# Patient Record
Sex: Female | Born: 1960 | Race: White | Hispanic: No | Smoking: Current some day smoker
Health system: Southern US, Community
[De-identification: ages and names within clinical notes are randomized; demographics above are authoritative.]

## PROBLEM LIST (undated history)

## (undated) DIAGNOSIS — E119 Type 2 diabetes mellitus without complications: Secondary | ICD-10-CM

## (undated) DIAGNOSIS — I10 Essential (primary) hypertension: Secondary | ICD-10-CM

## (undated) HISTORY — PX: ANKLE FRACTURE SURGERY: SHX122

---

## 1997-10-17 ENCOUNTER — Ambulatory Visit (HOSPITAL_COMMUNITY): Admission: RE | Admit: 1997-10-17 | Discharge: 1997-10-17 | Payer: Self-pay | Admitting: *Deleted

## 1998-07-24 ENCOUNTER — Encounter: Admission: RE | Admit: 1998-07-24 | Discharge: 1998-07-24 | Payer: Self-pay | Admitting: Family Medicine

## 1998-10-23 ENCOUNTER — Encounter: Admission: RE | Admit: 1998-10-23 | Discharge: 1998-10-23 | Payer: Self-pay | Admitting: Family Medicine

## 1998-10-31 ENCOUNTER — Encounter: Admission: RE | Admit: 1998-10-31 | Discharge: 1998-10-31 | Payer: Self-pay | Admitting: Family Medicine

## 1998-11-07 ENCOUNTER — Encounter: Admission: RE | Admit: 1998-11-07 | Discharge: 1998-11-07 | Payer: Self-pay | Admitting: Family Medicine

## 1999-01-30 ENCOUNTER — Encounter: Admission: RE | Admit: 1999-01-30 | Discharge: 1999-01-30 | Payer: Self-pay | Admitting: Family Medicine

## 1999-07-28 ENCOUNTER — Encounter: Admission: RE | Admit: 1999-07-28 | Discharge: 1999-07-28 | Payer: Self-pay | Admitting: Family Medicine

## 1999-10-21 ENCOUNTER — Encounter: Admission: RE | Admit: 1999-10-21 | Discharge: 1999-10-21 | Payer: Self-pay | Admitting: Sports Medicine

## 1999-11-09 ENCOUNTER — Encounter: Payer: Self-pay | Admitting: Specialist

## 1999-11-09 ENCOUNTER — Inpatient Hospital Stay (HOSPITAL_COMMUNITY): Admission: EM | Admit: 1999-11-09 | Discharge: 1999-11-11 | Payer: Self-pay | Admitting: Emergency Medicine

## 1999-11-09 ENCOUNTER — Encounter: Payer: Self-pay | Admitting: Emergency Medicine

## 2000-01-13 ENCOUNTER — Encounter: Admission: RE | Admit: 2000-01-13 | Discharge: 2000-01-13 | Payer: Self-pay | Admitting: Family Medicine

## 2000-04-21 ENCOUNTER — Encounter: Admission: RE | Admit: 2000-04-21 | Discharge: 2000-04-21 | Payer: Self-pay | Admitting: Family Medicine

## 2000-04-28 ENCOUNTER — Encounter: Admission: RE | Admit: 2000-04-28 | Discharge: 2000-04-28 | Payer: Self-pay | Admitting: Family Medicine

## 2000-07-28 ENCOUNTER — Encounter: Admission: RE | Admit: 2000-07-28 | Discharge: 2000-07-28 | Payer: Self-pay | Admitting: Family Medicine

## 2000-09-17 ENCOUNTER — Ambulatory Visit (HOSPITAL_COMMUNITY): Admission: RE | Admit: 2000-09-17 | Discharge: 2000-09-17 | Payer: Self-pay | Admitting: Orthopaedic Surgery

## 2000-09-17 ENCOUNTER — Encounter: Payer: Self-pay | Admitting: Orthopaedic Surgery

## 2000-09-17 ENCOUNTER — Emergency Department (HOSPITAL_COMMUNITY): Admission: EM | Admit: 2000-09-17 | Discharge: 2000-09-17 | Payer: Self-pay | Admitting: Emergency Medicine

## 2000-09-17 ENCOUNTER — Encounter: Payer: Self-pay | Admitting: Emergency Medicine

## 2000-11-26 ENCOUNTER — Encounter: Admission: RE | Admit: 2000-11-26 | Discharge: 2000-11-26 | Payer: Self-pay | Admitting: Family Medicine

## 2001-02-24 ENCOUNTER — Encounter: Admission: RE | Admit: 2001-02-24 | Discharge: 2001-02-24 | Payer: Self-pay | Admitting: Sports Medicine

## 2001-03-07 ENCOUNTER — Encounter: Admission: RE | Admit: 2001-03-07 | Discharge: 2001-03-07 | Payer: Self-pay | Admitting: Family Medicine

## 2001-05-14 ENCOUNTER — Emergency Department (HOSPITAL_COMMUNITY): Admission: EM | Admit: 2001-05-14 | Discharge: 2001-05-14 | Payer: Self-pay | Admitting: Emergency Medicine

## 2001-05-24 ENCOUNTER — Encounter: Admission: RE | Admit: 2001-05-24 | Discharge: 2001-05-24 | Payer: Self-pay | Admitting: Family Medicine

## 2001-08-23 ENCOUNTER — Encounter: Admission: RE | Admit: 2001-08-23 | Discharge: 2001-08-23 | Payer: Self-pay | Admitting: Family Medicine

## 2001-11-04 ENCOUNTER — Encounter (INDEPENDENT_AMBULATORY_CARE_PROVIDER_SITE_OTHER): Payer: Self-pay | Admitting: *Deleted

## 2001-11-18 ENCOUNTER — Encounter: Admission: RE | Admit: 2001-11-18 | Discharge: 2001-11-18 | Payer: Self-pay | Admitting: Family Medicine

## 2002-02-14 ENCOUNTER — Encounter: Admission: RE | Admit: 2002-02-14 | Discharge: 2002-02-14 | Payer: Self-pay | Admitting: Family Medicine

## 2002-05-09 ENCOUNTER — Encounter: Admission: RE | Admit: 2002-05-09 | Discharge: 2002-05-09 | Payer: Self-pay | Admitting: Family Medicine

## 2002-08-03 ENCOUNTER — Encounter: Admission: RE | Admit: 2002-08-03 | Discharge: 2002-08-03 | Payer: Self-pay | Admitting: Family Medicine

## 2002-09-18 ENCOUNTER — Ambulatory Visit (HOSPITAL_COMMUNITY): Admission: RE | Admit: 2002-09-18 | Discharge: 2002-09-18 | Payer: Self-pay | Admitting: Specialist

## 2002-09-18 ENCOUNTER — Encounter: Payer: Self-pay | Admitting: Specialist

## 2002-11-03 ENCOUNTER — Encounter: Admission: RE | Admit: 2002-11-03 | Discharge: 2002-11-03 | Payer: Self-pay | Admitting: Family Medicine

## 2003-01-25 ENCOUNTER — Ambulatory Visit (HOSPITAL_COMMUNITY): Admission: RE | Admit: 2003-01-25 | Discharge: 2003-01-25 | Payer: Self-pay | Admitting: Internal Medicine

## 2003-01-25 ENCOUNTER — Encounter: Payer: Self-pay | Admitting: Internal Medicine

## 2004-09-16 ENCOUNTER — Ambulatory Visit (HOSPITAL_COMMUNITY): Admission: RE | Admit: 2004-09-16 | Discharge: 2004-09-16 | Payer: Self-pay

## 2006-06-04 ENCOUNTER — Encounter (INDEPENDENT_AMBULATORY_CARE_PROVIDER_SITE_OTHER): Payer: Self-pay | Admitting: *Deleted

## 2007-06-16 ENCOUNTER — Ambulatory Visit: Payer: Self-pay | Admitting: Gynecology

## 2009-02-14 ENCOUNTER — Inpatient Hospital Stay (HOSPITAL_COMMUNITY): Admission: EM | Admit: 2009-02-14 | Discharge: 2009-02-18 | Payer: Self-pay | Admitting: Emergency Medicine

## 2009-03-22 ENCOUNTER — Ambulatory Visit: Payer: Self-pay | Admitting: Family Medicine

## 2009-05-06 ENCOUNTER — Ambulatory Visit: Payer: Self-pay | Admitting: Family Medicine

## 2009-08-10 ENCOUNTER — Emergency Department (HOSPITAL_COMMUNITY): Admission: EM | Admit: 2009-08-10 | Discharge: 2009-08-10 | Payer: Self-pay | Admitting: Emergency Medicine

## 2010-03-10 ENCOUNTER — Emergency Department (HOSPITAL_COMMUNITY)
Admission: EM | Admit: 2010-03-10 | Discharge: 2010-03-10 | Payer: Self-pay | Source: Home / Self Care | Admitting: Emergency Medicine

## 2010-07-09 LAB — DIFFERENTIAL
Basophils Absolute: 0.1 10*3/uL (ref 0.0–0.1)
Lymphocytes Relative: 27 % (ref 12–46)
Lymphs Abs: 2.6 10*3/uL (ref 0.7–4.0)
Monocytes Absolute: 0.4 10*3/uL (ref 0.1–1.0)
Monocytes Relative: 4 % (ref 3–12)
Neutro Abs: 6.5 10*3/uL (ref 1.7–7.7)

## 2010-07-09 LAB — CULTURE, ROUTINE-ABSCESS: Culture: NO GROWTH

## 2010-07-09 LAB — CBC
Hemoglobin: 12 g/dL (ref 12.0–15.0)
RBC: 3.57 MIL/uL — ABNORMAL LOW (ref 3.87–5.11)
WBC: 9.8 10*3/uL (ref 4.0–10.5)

## 2010-07-09 LAB — CULTURE, BLOOD (ROUTINE X 2)
Culture: NO GROWTH
Culture: NO GROWTH

## 2010-07-09 LAB — ANAEROBIC CULTURE

## 2010-08-22 NOTE — Op Note (Signed)
[place]. [facility]  Patient:    [name], [name]                         MRN: [id number] Proc. Date: 11/09/99 Adm. Date:  [id number] Attending:  Lubertha South                           Operative Report  PREOPERATIVE DIAGNOSIS:  Right ankle closed trimalleolar posterior fracture dislocation.  POSTOPERATIVE DIAGNOSIS:  Right ankle closed trimalleolar posterior fracture dislocation.  PROCEDURE:  Open reduction, internal fixation of the right trimalleolar ankle fracture dislocation using a 3.5 lag screw with a six hole one-third semitubular plate to the lateral malleolus, two 4.0 lag screws to the medial malleolus and one 4.0 cannulated screw to the posterior malleolus.  SURGEON:  Kerrin Champagne, M.D.  ASSISTANT:  Avel Peace, P.A.-C.  ANESTHESIA:  GOT by Dr. Michelle Piper.  ESTIMATED BLOOD LOSS:  30 cc.  TOURNIQUET TIME:  350 mmHg 1 hour and 33 minutes.  BRIEF CLINICAL HISTORY:  The patient is a 50 year old female without significant medical illness related that last evening while at a party, she stepped into a hole with her right foot twisting it and sustaining an injury that she felt was a sprain.  She went to the emergency room this morning unable to ambulate with severe swelling.  Radiographs demonstrated a right ankle fracture dislocation, posterior dislocation of the ankle joint, posterior fracture fragment and both lateral medial malleoli fracture.  She was brought to the operating room to undergo open reduction, internal fixation and internal fixation after attempts at closed manipulation were unsuccessful in the emergency room.  Because of the length the patient being dislocated, it was felt that represented an emergency.  Emergency room personnel were brought in and the call team brought in to perform surgery.  DESCRIPTION OF PROCEDURE:  After adequate general anesthesia the right lower extremity was prepped with DuraPrep solution from the  knee to the toe tips. The tourniquet was placed about the upper thigh.  The leg was draped in the usual manner.  Standard preoperative antibiotics of Ancef were given.  The incision over the lateral malleolus was in line with the superficial border of the fibula through the skin and subcu layers approximately 10-12 cm in length.  The incision was carried sharply down to bone.  The periosteum was then elevated both anteriorly and posteriorly exposing lateral aspect of the fracture site.  This was then irrigated using copious amounts of irrigant solution and the fracture site debrided of hematoma and soft tissue interposed.  The incision was the made over the medial malleolus.  A semi curved incision with the flap based posteriorly and proximally.  Through the skin and subcutaneous layers using a 10 blade scalpel, the saphenous vein was identified, ligated and divided to allow for further exposure of the anterior aspect of the ankle fracture involving the medial malleolus.  The malleolar fracture fragment was then reflected distally and the medial joint line was inspected.  There was evident posterior malleolar fracture fragment involved about 15-20% of the joint surface.  Therefore, the joint surface was irrigated with copious amounts of irrigant solution.  Small fragments of bone debrided were found present.  The medial malleolus was then reduced and reduction was difficult as was the posterior malleolar fracture fragment difficult.  It was decided to perform initial reduction of the lateral malleolus which would  help reduction of the posterior malleolar fracture fragment.  This was done by first approximating the fracture site and reducing the fracture and holding in place using a lobster claw bone-holding forceps.  This was done with longitudinal traction and slight internal rotation of the fracture fragment.  Once reduced, lag screw was placed from anterior obliquely across the  fracture site to posterior from proximal to distal, first, using a 3.5 drill bit and then using the appropriate sleeve for drilling the deep cortex using a 2.9 drill bit measuring for depth and placing the appropriate 3.5 screw obliquely across the fracture site, fixing and lagging it in compression.  Neutralization plate then using a 3.5, six hole semitubular ACE plate was then placed and then drill holes were placed across each of the cortices.  Then, using 3.5 cortical screws the neutralization plate was placed without difficulty.  Note, 3.5 cortical screws were used over the proximal four screws and the distal two screws were fully threaded cancellous screws just short of penetrating the joint surface.  With the lateral malleolus fracture fixed and the posterior malleolar fracture fragment reduced somewhat nicely.  The medial malleolar fracture fragment could be reduced and held in place with a pin using a single 2.0 mm pin anteriorly. Then, using a 2.9 drill bit, the drill hole was placed parallel through the first pin through the medial malleolus obliquely entering into the tibia at an angle parallel to the joint surface medially as well as at an angle anteriorly to allow for further fixation of the posterior malleolar fracture fragments.  The posterior drill hole being made, then tapped with a 4.0 tap after measuring for depth and a 50 mm partially threaded 4.0 lag screw was then placed fixing the posterior portion of the medial malleolus.  The anterior pin was then removed and the 2.9 drill bit was used to drill a similar hole in the same direction through the same pin track site.  This was measured for depth.  Again a 50 screw was chose tapping the superficial cortice and then the anterior 4.0 lag screw then used to fix the medial malleolus anteriorly.  This provided excellent fixation across the anterior aspect of the medial malleolus.  Posterior malleolar fracture fragment  was found to be still somewhat displaced superiorly on the lateral views. Therefore, small incision was made just proximal 3-4 cm proximal to the incision over the medial malleolus over the posterior aspect of the tibia.  This fracture fragment was then reduced using a large reducing tenaculum across the fracture site.  Then using a soft tissue sleeve, a 2.0 mm guide pin was placed to fix the posterior malleolus fracture fragment from posterior medial to anterolateral. This was measured for depth and the appropriate screw chosen a partially threaded cannulated 4.0 lag screw.  Drilling using a 2.9 drill bit and then tapping near cortex, the screw was then screwed into place fixing the posterior malleolar fracture fragment while the foot was held in dorsiflexion reducing the fracture nicely posterior to anterior.  Irrigation was then performed over all incisions.  Permanent C-arm images were obtained for documentation purposes.  The incisions were closed by reapproximating the medial malleolar incision first approximating the patients periosteum over the fracture site and closing this appropriately deeply and then closing the subcutaneous layers using interrupted 0 and 2-0 Vicryl sutures.  Skin was closed with stainless steel staples.  A single subcutaneous stitch of 2-0 Vicryl was placed over the stab incision for the posterior  malleolar fracture fixation screw.  This was then closed with a single staple.  The lateral malleolus fixation site was irrigated and then the deep subcu layers were reapproximated with interrupted 0 Vicryl and the more superficial layers with interrupted 2-0 Vicryl sutures and the skin closed with stainless steel staples.  Adaptic, 4x4s, ABD pads were affixed to the skin with sterile Webril and a well-padded posterior splint extending from the MTP joints up to the posterior calf below the knee were placed.  The patients tourniquet was released.  The total  tourniquet time was 1 hour and 33 minutes at 350 mmHg.  The patient was then reactivated and returned to the recovery room in satisfactory condition.  All instrument and sponge counts were correct. DD:  11/09/99 TD:  11/10/99 Job: [id number]

## 2010-08-22 NOTE — Op Note (Signed)
[name], [name]                            ACCOUNT NO.:  [id number]   MEDICAL RECORD NO.:  [id number]                   PATIENT TYPE:  AMB   LOCATION:  DAY                                  FACILITY:  [facility]   PHYSICIAN:  Kerrin Champagne, M.D.                DATE OF BIRTH:  04-26-1960   DATE OF PROCEDURE:  09/18/2002  DATE OF DISCHARGE:                                 OPERATIVE REPORT   PREOPERATIVE DIAGNOSES:  1. Right medial ankle pain, status post open reduction, internal fixation     for trimalleolar ankle fracture three years ago.  2. Possible intraarticular loose body by CT scan.   POSTOPERATIVE DIAGNOSES:  1. Painful hardware, right medial malleolus three lag screws.  2. 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   PROCEDURES:  1. Right ankle removal of three lag screws medially.  2. Right ankle diagnostic and operative arthroscopy with shaving of the     medial joint line and shaving of a fibrous scar and band across the     anteromedial aspect of the ankle joint.   SURGEON:  Kerrin Champagne, M.D.   ASSISTANT:  Wende Neighbors, P.A.-C.   ANESTHESIACamie Patience, Jenelle Mages. Fortune, M.D.   ESTIMATED BLOOD LOSS:  20 mL.   BRIEF CLINICAL HISTORY:  The patient, a 50 year old female, who sustained  right ankle fracture nearly three years ago, underwent open reduction,  internal fixation using a small fragment set.  A cannulated screw to the  medial malleolus and two lag screws fixing the malleolus in addition.  She  did well postoperatively, returns three years later with persistent  anteromedial ankle and joint pain and excellent range of motion with  grating/clicking over the anteromedial aspect of the ankle joint, tenderness  over the distal screws of medial malleolus.  She underwent CT scan which  demonstrated a possible loose body within the  medial joint line.  Some  impingement of the medial malleolus against the medial aspect of the talar  head.  The patient's hardware appeared to be intact with fracture healed.  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   INTRAOPERATIVE FINDINGS:  The patient was found to have a fibrous band over  the medial aspect of the anterior ankle joint that appeared to impinge on  the medial talus, talar head with flexion and extension maneuvers.  There  was scar tissue within the medial joint space and evidence of  arthrofibrosis.  Following debridement, the patient has some very minimally  grating present but no popping or clicking as was present preoperatively.  The three screws were removed without difficulty.   DESCRIPTION OF PROCEDURE:  After adequate general anesthesia, bump under the  right buttock, right  lower extremity tourniquet, stab incisions made over  the medial joint line following standard prep and drape.  These were done  using C-arm fluoroscopy, screws localized and after ascertaining that these  were screws, the screw driver was used to remove each of those screws  individually without much difficulty.  Stab incisions three in total were  used medially, totaling about 1 cm each.  These were then hemostased with  direct pressure.  The anatomy of the ankle joint was then defined using 18  gauge spinal needle, defining the tendinous structures, anterior tibial  tendon, the patient's peroneus tertius as well as the common extensor  tendons wad.  There were identified as well as the superficial peroneal  nerve laterally.  With these identified, then markers were placed for the  expected placement of the cannulas and the instruments for debriding of the  ankle joint.   A stab incision was made into the anteromedial and anterolateral aspects of  the ankle joint, first through the skin and then the soft tissue spread  using a hemostat.  The ankle then inflated  using about 30 mL of irrigant  solution.  First a 3.9 cannula and ankle scope were used, and these did not  show the anatomy quite well, so that a change was made to a 3.2 scope.  This  was done without difficulty.  This inserted through the anterolateral  portal, used to examine the anteromedial joint line, identifying scar tissue  present here.  Through the stab incision over the anteromedial aspect of the  ankle joint, then a small shaver was inserted and using the small shaver,  then the medial joint line was debrided of synovitis changes as well as  dense fibrous scar tissue.  A band of tissue over the anterior aspect of the  medial talar head was noted to be present, impinging on the talar head and  dome with flexion/extension.  This demonstrated a general popping with  flexion and extension maneuvers to the ankle joint, and this was debrided to  allow for free motion in this area.  Once this was completed, further  irrigation was carried out over the joint documentation using the  instruments, photomicrograph equipment was performed.  After further  irrigation, then the scope as well as the instruments were removed from the  ankle joint.  The medial incisions used for removal of hardware were closed  with interrupted sutures of 4-0 nylon.  The ankle arthroscopy incisions were  closed with interrupted subcu stitches of 3-0 Vicryl.  Tincture of Benzoin  and Steri-Strips applied to the ankle arthroscopy incisions.  Then 4 x 4's  affixed to the skin with some sterile Webril and then a four-inch Ace wrap  applied.  The patient was then reactivated following release of the  tourniquet, extubated, and returned to the recovery room in satisfactory  condition.  All instrument and sponge counts were correct.                                               Kerrin Champagne, M.D.    JEN/MEDQ  D:  09/18/2002  T:  09/18/2002  Job:  [id number]

## 2010-08-22 NOTE — Discharge Summary (Signed)
[place]. [facility]  Patient:    [name], [name]                         MRN: [id number] Adm. Date:  [id number] Disc. Date: [id number] Attending:  Lubertha South Dictator:   Ralene Bathe, P.A.                           Discharge Summary  ADMITTING DIAGNOSES: 1. Right trimalleolar fracture ankle fracture dislocation. 2. Depression.  DISCHARGE DIAGNOSES: 1. Right trimalleolar fracture ankle fracture dislocation. 2. Depression. 3. Status post open reduction, internal fixation right ankle.  CONSULTS:  None.  OPERATIONS:  Open reduction, internal fixation of right trimalleolar ankle fracture dislocation with Blaken screws, also a ______ screw to the posterior malleolus was used along with lag screws to the medial malleolus.  SURGEON:  Kerrin Champagne, M.D.  ASSISTANT:  Alexzandrew L. Perkins, P.A.-C.  ANESTHESIA:  General.  BRIEF HISTORY:  28 female stepped into a hole, twisting her ankle the night prior to admission.  It was felt she had a severe sprained ankle, went home, and had significant swelling and was brought to [facility] emergency room on the morning of admission.  X-rays showed a trimalleolar ankle fracture dislocation.  HOSPITAL COURSE:  The patient was admitted.  Surgical intervention was indicated for the above-named fracture.  Risks and benefits were discussed with the patient.  She was in agreement and wished to proceed.  The patient underwent the above-named surgery and tolerated this well.  All appropriate IV antibiotics and analgesics were provided.  Postoperatively, the patient was placed in a posterior splint, well molded, and placed nonweightbearing to the operative extremity.  She was placed on ice and elevation, and all appropriate IV antibiotics were used.  PCA analgesics were used, and patient was able to be weaned off of these by the evening of postoperative day one.  She did have some trouble with the original p.o.  analgesic.  Percocet caused itching, and this was changed to [employer].  She got better pain relief with this medication, and on the morning of postoperative day two, she was much less painful.  Her medications were providing adequate coverage.  She had done well with physical therapy to maintain a nonweightbearing status.  At this time, she was felt safe medically and orthopedically to be discharged to home.  She was neurovascularly intact to her right lower extremity.  She was afebrile. All questions were answered, and the patient was discharged to home.  LABORATORY DATA:  Admission chemistries within normal limits.  Blood gas also borderline normal with a PCO2 of 24, pH of 7.42, hemogram on admission normal. Blood type A positive.  EKG and chest x-ray not done, not indicated.  Ankle films showed a trimalleolar ankle fracture dislocation on the right.  CONDITION ON DISCHARGE:  Stable.  DISCHARGE MEDICATIONS AND PLANS:  The patient is being discharged to home in care of her family and friends and boyfriend.  She is to follow-up in our office in 14 days and call for a time.  She is given the following prescriptions:  Mepergan Fortis #50 1-2 q.4-6h. p.r.n. pain.  She is to take one aspirin q day.  Continue ice and elevation of right lower extremity. Nonweightbearing right lower extremity with walker.  Resume home medications and home diet.  Call the office for any further problems. DD:  11/11/99 TD:  11/11/99 Job: [id number]

## 2012-09-28 ENCOUNTER — Telehealth: Payer: Self-pay | Admitting: *Deleted

## 2012-09-28 NOTE — Telephone Encounter (Signed)
Patient needs appointment. Please schedule

## 2012-10-19 NOTE — Telephone Encounter (Signed)
Called pt multiple times to number listed, no answer.

## 2015-07-02 ENCOUNTER — Other Ambulatory Visit (HOSPITAL_COMMUNITY): Payer: Self-pay | Admitting: *Deleted

## 2015-07-02 DIAGNOSIS — Z1231 Encounter for screening mammogram for malignant neoplasm of breast: Secondary | ICD-10-CM

## 2015-07-08 ENCOUNTER — Ambulatory Visit (HOSPITAL_COMMUNITY)
Admission: RE | Admit: 2015-07-08 | Discharge: 2015-07-08 | Disposition: A | Discharge status: Home / Self Care | Payer: PRIVATE HEALTH INSURANCE | Source: Ambulatory Visit | Attending: *Deleted | Admitting: *Deleted

## 2015-07-08 DIAGNOSIS — Z1231 Encounter for screening mammogram for malignant neoplasm of breast: Secondary | ICD-10-CM | POA: Diagnosis present

## 2016-07-14 ENCOUNTER — Other Ambulatory Visit (HOSPITAL_COMMUNITY): Payer: Self-pay | Admitting: *Deleted

## 2016-07-14 DIAGNOSIS — Z1231 Encounter for screening mammogram for malignant neoplasm of breast: Secondary | ICD-10-CM

## 2016-07-27 ENCOUNTER — Ambulatory Visit (HOSPITAL_COMMUNITY)
Admission: RE | Admit: 2016-07-27 | Discharge: 2016-07-27 | Disposition: A | Discharge status: Home / Self Care | Payer: PRIVATE HEALTH INSURANCE | Source: Ambulatory Visit | Attending: *Deleted | Admitting: *Deleted

## 2016-07-27 DIAGNOSIS — Z1231 Encounter for screening mammogram for malignant neoplasm of breast: Secondary | ICD-10-CM | POA: Insufficient documentation

## 2017-10-05 ENCOUNTER — Other Ambulatory Visit (HOSPITAL_COMMUNITY): Payer: Self-pay | Admitting: *Deleted

## 2017-10-05 DIAGNOSIS — Z1231 Encounter for screening mammogram for malignant neoplasm of breast: Secondary | ICD-10-CM

## 2017-10-08 ENCOUNTER — Ambulatory Visit (HOSPITAL_COMMUNITY): Payer: PRIVATE HEALTH INSURANCE

## 2017-10-08 ENCOUNTER — Encounter (HOSPITAL_COMMUNITY): Payer: Self-pay

## 2017-10-14 ENCOUNTER — Ambulatory Visit (HOSPITAL_COMMUNITY)
Admission: RE | Admit: 2017-10-14 | Discharge: 2017-10-14 | Disposition: A | Discharge status: Home / Self Care | Payer: PRIVATE HEALTH INSURANCE | Source: Ambulatory Visit | Attending: *Deleted | Admitting: *Deleted

## 2017-10-14 ENCOUNTER — Encounter (HOSPITAL_COMMUNITY): Payer: Self-pay

## 2017-10-14 DIAGNOSIS — Z1231 Encounter for screening mammogram for malignant neoplasm of breast: Secondary | ICD-10-CM | POA: Diagnosis present

## 2018-11-15 ENCOUNTER — Other Ambulatory Visit (HOSPITAL_COMMUNITY): Payer: Self-pay | Admitting: *Deleted

## 2018-11-15 DIAGNOSIS — Z1231 Encounter for screening mammogram for malignant neoplasm of breast: Secondary | ICD-10-CM

## 2018-11-28 ENCOUNTER — Other Ambulatory Visit: Payer: Self-pay

## 2018-11-28 ENCOUNTER — Ambulatory Visit (HOSPITAL_COMMUNITY)
Admission: RE | Admit: 2018-11-28 | Discharge: 2018-11-28 | Disposition: A | Discharge status: Home / Self Care | Payer: PRIVATE HEALTH INSURANCE | Source: Ambulatory Visit | Attending: *Deleted | Admitting: *Deleted

## 2018-11-28 DIAGNOSIS — Z1231 Encounter for screening mammogram for malignant neoplasm of breast: Secondary | ICD-10-CM | POA: Diagnosis present

## 2019-10-17 ENCOUNTER — Emergency Department (HOSPITAL_COMMUNITY): Payer: Self-pay

## 2019-10-17 ENCOUNTER — Encounter (HOSPITAL_COMMUNITY): Payer: Self-pay | Admitting: *Deleted

## 2019-10-17 ENCOUNTER — Other Ambulatory Visit: Payer: Self-pay

## 2019-10-17 ENCOUNTER — Emergency Department (HOSPITAL_COMMUNITY)
Admission: EM | Admit: 2019-10-17 | Discharge: 2019-10-17 | Disposition: A | Discharge status: Home / Self Care | Payer: Self-pay | Attending: Emergency Medicine | Admitting: Emergency Medicine

## 2019-10-17 DIAGNOSIS — Z23 Encounter for immunization: Secondary | ICD-10-CM | POA: Insufficient documentation

## 2019-10-17 DIAGNOSIS — F17200 Nicotine dependence, unspecified, uncomplicated: Secondary | ICD-10-CM | POA: Insufficient documentation

## 2019-10-17 DIAGNOSIS — Y999 Unspecified external cause status: Secondary | ICD-10-CM | POA: Insufficient documentation

## 2019-10-17 DIAGNOSIS — S01511A Laceration without foreign body of lip, initial encounter: Secondary | ICD-10-CM | POA: Insufficient documentation

## 2019-10-17 DIAGNOSIS — Y939 Activity, unspecified: Secondary | ICD-10-CM | POA: Insufficient documentation

## 2019-10-17 DIAGNOSIS — E119 Type 2 diabetes mellitus without complications: Secondary | ICD-10-CM | POA: Insufficient documentation

## 2019-10-17 DIAGNOSIS — R42 Dizziness and giddiness: Secondary | ICD-10-CM | POA: Insufficient documentation

## 2019-10-17 DIAGNOSIS — R55 Syncope and collapse: Secondary | ICD-10-CM | POA: Insufficient documentation

## 2019-10-17 DIAGNOSIS — Z7984 Long term (current) use of oral hypoglycemic drugs: Secondary | ICD-10-CM | POA: Insufficient documentation

## 2019-10-17 DIAGNOSIS — S161XXA Strain of muscle, fascia and tendon at neck level, initial encounter: Secondary | ICD-10-CM

## 2019-10-17 DIAGNOSIS — Y929 Unspecified place or not applicable: Secondary | ICD-10-CM | POA: Insufficient documentation

## 2019-10-17 DIAGNOSIS — W19XXXA Unspecified fall, initial encounter: Secondary | ICD-10-CM | POA: Insufficient documentation

## 2019-10-17 HISTORY — DX: Type 2 diabetes mellitus without complications: E11.9

## 2019-10-17 LAB — CBC WITH DIFFERENTIAL/PLATELET
Abs Immature Granulocytes: 0.03 10*3/uL (ref 0.00–0.07)
Basophils Absolute: 0.1 10*3/uL (ref 0.0–0.1)
Basophils Relative: 1 %
Eosinophils Absolute: 0.7 10*3/uL — ABNORMAL HIGH (ref 0.0–0.5)
Eosinophils Relative: 6 %
HCT: 41 % (ref 36.0–46.0)
Hemoglobin: 13.5 g/dL (ref 12.0–15.0)
Immature Granulocytes: 0 %
Lymphocytes Relative: 25 %
Lymphs Abs: 2.6 10*3/uL (ref 0.7–4.0)
MCH: 30.9 pg (ref 26.0–34.0)
MCHC: 32.9 g/dL (ref 30.0–36.0)
MCV: 93.8 fL (ref 80.0–100.0)
Monocytes Absolute: 0.5 10*3/uL (ref 0.1–1.0)
Monocytes Relative: 5 %
Neutro Abs: 6.4 10*3/uL (ref 1.7–7.7)
Neutrophils Relative %: 63 %
Platelets: 281 10*3/uL (ref 150–400)
RBC: 4.37 MIL/uL (ref 3.87–5.11)
RDW: 12.9 % (ref 11.5–15.5)
WBC: 10.3 10*3/uL (ref 4.0–10.5)
nRBC: 0 % (ref 0.0–0.2)

## 2019-10-17 LAB — COMPREHENSIVE METABOLIC PANEL
ALT: 17 U/L (ref 0–44)
AST: 18 U/L (ref 15–41)
Albumin: 4.5 g/dL (ref 3.5–5.0)
Alkaline Phosphatase: 55 U/L (ref 38–126)
Anion gap: 11 (ref 5–15)
BUN: 16 mg/dL (ref 6–20)
CO2: 24 mmol/L (ref 22–32)
Calcium: 9.8 mg/dL (ref 8.9–10.3)
Chloride: 104 mmol/L (ref 98–111)
Creatinine, Ser: 0.62 mg/dL (ref 0.44–1.00)
GFR calc Af Amer: >60 mL/min (ref >60)
GFR calc non Af Amer: >60 mL/min (ref >60)
Glucose, Bld: 102 mg/dL — ABNORMAL HIGH (ref 70–99)
Potassium: 4.4 mmol/L (ref 3.5–5.1)
Sodium: 139 mmol/L (ref 135–145)
Total Bilirubin: 0.5 mg/dL (ref 0.3–1.2)
Total Protein: 7.7 g/dL (ref 6.5–8.1)

## 2019-10-17 LAB — TROPONIN I (HIGH SENSITIVITY)
Troponin I (High Sensitivity): 3 ng/L (ref <18)
Troponin I (High Sensitivity): 4 ng/L (ref <18)

## 2019-10-17 MED ORDER — SODIUM CHLORIDE 0.9 % IV BOLUS: 1000.0000 mL | Freq: Once | INTRAVENOUS | Status: DC

## 2019-10-17 MED ORDER — METHOCARBAMOL 500 MG PO TABS
500.0000 mg | ORAL_TABLET | Freq: Three times a day (TID) | ORAL | 0 refills | Status: DC
Start: 2019-10-17 — End: 2019-10-27

## 2019-10-17 MED ORDER — TETANUS-DIPHTH-ACELL PERTUSSIS 5-2.5-18.5 LF-MCG/0.5 IM SUSP
0.5000 mL | Freq: Once | INTRAMUSCULAR | Status: AC
  Administered 2019-10-17: 0.5 mL via INTRAMUSCULAR
  Filled 2019-10-17: qty 0.5

## 2019-10-17 MED ORDER — LIDOCAINE HCL (PF) 2 % IJ SOLN
2.0000 mL | Freq: Once | INTRAMUSCULAR | Status: AC
  Administered 2019-10-17: 2 mL via INTRADERMAL

## 2019-10-17 MED ORDER — HYDROCODONE-ACETAMINOPHEN 5-325 MG PO TABS: ORAL_TABLET | ORAL | 0 refills | Status: DC

## 2019-10-17 NOTE — Discharge Instructions (Signed)
The CT scan of your head and neck did not show evidence of any bony injuries.  You likely has a strain to the muscles of your neck.  You may alternate ice packs on and off to your neck and shoulders.  Avoid heavy lifting or straining for at least 1 week.  The sutures in your lip will dissolve in a week or so.  Soft foods and liquids for at least 4 to 5 days.  Follow-up with your primary doctor for recheck, return to the emergency department for any worsening symptoms.

## 2019-10-17 NOTE — ED Provider Notes (Signed)
[facility] EMERGENCY DEPARTMENT Provider Note   CSN: [id number] Arrival date & time: 10/17/19  4098     History Chief Complaint  Patient presents with  . Loss of Consciousness    [name] is a 59 y.o. female.  Patient had a syncopal episode today after having a bowel movement, patient feels fine now.  Patient had a small laceration to her upper lip   The history is provided by the patient. A language interpreter was used.  Loss of Consciousness Episode history:  Single Most recent episode:  Today Timing:  Sporadic Progression:  Resolved Chronicity:  New Context: not blood draw   Witnessed: no   Relieved by:  Nothing Associated symptoms: dizziness   Associated symptoms: no chest pain, no headaches and no seizures        Past Medical History:  Diagnosis Date  . Diabetes mellitus without complication (HCC)     There are no problems to display for this patient.   Past Surgical History:  Procedure Laterality Date  . ANKLE FRACTURE SURGERY Right      OB History   No obstetric history on file.     History reviewed. No pertinent family history.  Social History   Tobacco Use  . Smoking status: Current Every Day Smoker    Packs/day: 0.50  . Smokeless tobacco: Never Used  Vaping Use  . Vaping Use: Never used  Substance Use Topics  . Alcohol use: Not Currently  . Drug use: Not Currently    Home Medications Prior to Admission medications   Medication Sig Start Date End Date Taking? Authorizing Provider  Calcium Carbonate Antacid (ANTACID PO) Take 1 tablet by mouth daily.   Yes [provider]  diphenhydrAMINE (BENADRYL ALLERGY) 25 MG tablet Take 50 mg by mouth 2 (two) times daily.   Yes [provider]  Doxylamine Succinate, Sleep, (SLEEP AID PO) Take 2 tablets by mouth daily.   Yes [provider]  KRILL OIL PO Take 2 tablets by mouth 2 (two) times daily.   Yes [provider]  lisinopril (ZESTRIL) 10 MG tablet  Take 10 mg by mouth daily. 10/04/19  Yes [provider]  loratadine (CLARITIN) 10 MG tablet Take 10 mg by mouth daily.   Yes [provider]  metFORMIN (GLUCOPHAGE) 1000 MG tablet Take 1,000 mg by mouth 2 (two) times daily. 06/20/19  Yes [provider]  Multiple Vitamin (MULTIVITAMIN ADULT PO) Take 1 tablet by mouth.   Yes [provider]  simvastatin (ZOCOR) 20 MG tablet Take 20 mg by mouth at bedtime. 08/04/19  Yes [provider]  traZODone (DESYREL) 100 MG tablet Take 50-100 mg by mouth at bedtime as needed. 10/01/19  Yes [provider]    Allergies    Patient has no known allergies.  Review of Systems   Review of Systems  Constitutional: Negative for appetite change and fatigue.  HENT: Negative for congestion, ear discharge and sinus pressure.   Eyes: Negative for discharge.  Respiratory: Negative for cough.   Cardiovascular: Positive for syncope. Negative for chest pain.  Gastrointestinal: Negative for abdominal pain and diarrhea.  Genitourinary: Negative for frequency and hematuria.  Musculoskeletal: Negative for back pain.  Skin: Negative for rash.  Neurological: Positive for dizziness. Negative for seizures and headaches.  Psychiatric/Behavioral: Negative for hallucinations.    Physical Exam Updated Vital Signs BP 140/63 (BP Location: Left Arm)   Pulse 75   Temp 98.7 F (37.1 C) (Oral)  Resp 16   Ht 5' 7.5" (1.715 m)   Wt 77.6 kg   SpO2 95%   BMI 26.39 kg/m   Physical Exam Vitals and nursing note reviewed.  Constitutional:      Appearance: She is well-developed.  HENT:     Head: Normocephalic.     Nose: Nose normal.     Mouth/Throat:     Comments: 1.5 cm lac upper lip Eyes:     General: No scleral icterus.    Conjunctiva/sclera: Conjunctivae normal.  Neck:     Thyroid: No thyromegaly.  Cardiovascular:     Rate and Rhythm: Normal rate and regular rhythm.     Heart sounds: No murmur heard.  No  friction rub. No gallop.   Pulmonary:     Breath sounds: No stridor. No wheezing or rales.  Chest:     Chest wall: No tenderness.  Abdominal:     General: There is no distension.     Tenderness: There is no abdominal tenderness. There is no rebound.  Musculoskeletal:        General: Normal range of motion.     Cervical back: Neck supple.  Lymphadenopathy:     Cervical: No cervical adenopathy.  Skin:    Findings: No erythema or rash.  Neurological:     Mental Status: She is alert and oriented to person, place, and time.     Motor: No abnormal muscle tone.     Coordination: Coordination normal.  Psychiatric:        Behavior: Behavior normal.     ED Results / Procedures / Treatments   Labs (all labs ordered are listed, but only abnormal results are displayed) Labs Reviewed  CBC WITH DIFFERENTIAL/PLATELET - Abnormal; Notable for the following components:      Result Value   Eosinophils Absolute 0.7 (*)    All other components within normal limits  COMPREHENSIVE METABOLIC PANEL - Abnormal; Notable for the following components:   Glucose, Bld 102 (*)    All other components within normal limits  TROPONIN I (HIGH SENSITIVITY)  TROPONIN I (HIGH SENSITIVITY)    EKG None  Radiology CT Head Wo Contrast  Result Date: 10/17/2019 CLINICAL DATA:  Head trauma.  Syncope and fall. EXAM: CT HEAD WITHOUT CONTRAST TECHNIQUE: Contiguous axial images were obtained from the base of the skull through the vertex without intravenous contrast. COMPARISON:  None. FINDINGS: Brain: There is no evidence of acute infarct, intracranial hemorrhage, mass, midline shift, or extra-axial fluid collection. The ventricles and sulci are within normal limits for age. Vascular: No hyperdense vessel. Skull: No fracture or suspicious osseous lesion. Sinuses/Orbits: Visualized paranasal sinuses and mastoid air cells are clear. Unremarkable orbits. Other: None. IMPRESSION: Negative head CT. Electronically Signed   By:  Sebastian Ache M.D.   On: 10/17/2019 13:53   CT Cervical Spine Wo Contrast  Result Date: 10/17/2019 CLINICAL DATA:  Neck trauma.  Fall. EXAM: CT CERVICAL SPINE WITHOUT CONTRAST TECHNIQUE: Multidetector CT imaging of the cervical spine was performed without intravenous contrast. Multiplanar CT image reconstructions were also generated. COMPARISON:  None. FINDINGS: Alignment: Cervical spine straightening.  No listhesis. Skull base and vertebrae: No acute fracture or suspicious osseous lesion. Bulky anterior vertebral spurring from C4-C7. Soft tissues and spinal canal: No prevertebral fluid or swelling. No visible canal hematoma. Disc levels: Mild disc space narrowing at C5-6. Calcified disc bulging and spurring at C5-6 results in moderate spinal stenosis and severe left neural foraminal stenosis. Upper chest: Partially visualized mild mosaic  attenuation in the lung apices. Other: None. IMPRESSION: 1. No acute cervical spine fracture or subluxation. 2. Cervical spondylosis most notable at C5-6 where there is moderate spinal stenosis and severe left neural foraminal stenosis. Electronically Signed   By: Sebastian Ache M.D.   On: 10/17/2019 13:34    Procedures Procedures (including critical care time)  Medications Ordered in ED Medications  sodium chloride 0.9 % bolus 1,000 mL (has no administration in time range)  Tdap (BOOSTRIX) injection 0.5 mL (0.5 mLs Intramuscular Given 10/17/19 1347)  lidocaine HCl (PF) (XYLOCAINE) 2 % injection 2 mL (2 mLs Intradermal Given 10/17/19 1346)    ED Course  I have reviewed the triage vital signs and the nursing notes.  Pertinent labs & imaging results that were available during my care of the patient were reviewed by me and considered in my medical decision making (see chart for details).    MDM Rules/Calculators/A&P                         Labs and x-rays unremarkable.  Pt had laceration repaired by pa triplet  and will follow up as needed          This  patient presents to the ED for concern of fall, this involves an extensive number of treatment options, and is a complaint that carries with it a high risk of complications and morbidity.  The differential diagnosis includes vasovagal stroke   Lab Tests:   I Ordered, reviewed, and interpreted labs, which included CBC chemistries  Medicines ordered:   I ordered medication normal saline for hydration  Imaging Studies ordered:   I ordered imaging studies which included ct head and cervical spine and  I independently visualized and interpreted imaging which showed unremarkable  Additional history obtained:   Additional history obtained from records  Previous records obtained and reviewed.  Consultations Obtained:   I consulted Pa TAmmy Triplet  and discussed lab and imaging findings  Reevaluation:  After the interventions stated above, I reevaluated the patient and found improved  Critical Interventions:  .   Final Clinical Impression(s) / ED Diagnoses Final diagnoses:  None    Rx / DC Orders ED Discharge Orders    None       Bethann Berkshire, MD 10/18/19 1048

## 2019-10-17 NOTE — ED Provider Notes (Signed)
   [name] is a 59 year old female with injury of her right upper lip secondary to a syncopal episode that resulted in a fall.  Patient seen and evaluated by Dr. Estell Harpin.  I was asked to repair laceration to her lip.  This is my only involvement in this patient's care.   Marland Kitchen.Laceration Repair  Date/Time: 10/17/2019 1:39 PM Performed by: Pauline Aus, PA-C Authorized by: Pauline Aus, PA-C   Consent:    Consent obtained:  Verbal   Consent given by:  Patient   Risks discussed:  Infection, need for additional repair, pain, poor cosmetic result and poor wound healing   Alternatives discussed:  No treatment and delayed treatment Universal protocol:    Procedure explained and questions answered to patient or proxy's satisfaction: yes     Relevant documents present and verified: yes     Test results available and properly labeled: yes     Imaging studies available: yes     Required blood products, implants, devices, and special equipment available: yes     Site/side marked: yes     Immediately prior to procedure, a time out was called: yes     Patient identity confirmed:  Verbally with patient and arm band Anesthesia (see MAR for exact dosages):    Anesthesia method:  Local infiltration   Local anesthetic:  Lidocaine 2% w/o epi Laceration details:    Location:  Lip   Lip location:  Upper interior lip   Length (cm):  1.5 Repair type:    Repair type:  Simple Pre-procedure details:    Preparation:  Patient was prepped and draped in usual sterile fashion Exploration:    Hemostasis obtained with: bleeding controlled.   Wound extent: no foreign bodies/material noted and no vascular damage noted     Contaminated: no   Treatment:    Area cleansed with:  Saline   Amount of cleaning:  Standard   Irrigation solution:  Sterile saline   Irrigation method:  Syringe   Visualized foreign bodies/material removed: no   Skin repair:    Repair method:  Sutures   Suture size:  6-0   Wound  skin closure material used: vicryl rapide.   Suture technique:  Simple interrupted   Number of sutures:  3 Approximation:    Approximation:  Close   Vermilion border well-aligned: no injury to vermillion border.   Post-procedure details:    Dressing:  Open (no dressing)   Patient tolerance of procedure:  Tolerated well, no immediate complications      Pauline Aus, PA-C 10/17/19 1409    Bethann Berkshire, MD 10/18/19 [phone]

## 2019-10-17 NOTE — ED Triage Notes (Signed)
Pt states she got up at 4am and went to the bathroom and states she passed out and woke up on the floor; pt has a laceration to top of her lip; pt states she woke up with a strange feeling, she went to the bathroom and had a severe stomach cramp, then broke out in a sweat, had an episode of diarrhea and passed out

## 2019-10-20 ENCOUNTER — Emergency Department (HOSPITAL_COMMUNITY): Payer: Self-pay

## 2019-10-20 ENCOUNTER — Emergency Department (HOSPITAL_COMMUNITY)
Admission: EM | Admit: 2019-10-20 | Discharge: 2019-10-20 | Disposition: A | Discharge status: Home / Self Care | Payer: Self-pay | Attending: Emergency Medicine | Admitting: Emergency Medicine

## 2019-10-20 ENCOUNTER — Other Ambulatory Visit: Payer: Self-pay

## 2019-10-20 ENCOUNTER — Encounter (HOSPITAL_COMMUNITY): Payer: Self-pay | Admitting: Emergency Medicine

## 2019-10-20 DIAGNOSIS — Y929 Unspecified place or not applicable: Secondary | ICD-10-CM | POA: Insufficient documentation

## 2019-10-20 DIAGNOSIS — Y999 Unspecified external cause status: Secondary | ICD-10-CM | POA: Insufficient documentation

## 2019-10-20 DIAGNOSIS — M7918 Myalgia, other site: Secondary | ICD-10-CM | POA: Insufficient documentation

## 2019-10-20 DIAGNOSIS — E119 Type 2 diabetes mellitus without complications: Secondary | ICD-10-CM | POA: Insufficient documentation

## 2019-10-20 DIAGNOSIS — Z7984 Long term (current) use of oral hypoglycemic drugs: Secondary | ICD-10-CM | POA: Insufficient documentation

## 2019-10-20 DIAGNOSIS — F17210 Nicotine dependence, cigarettes, uncomplicated: Secondary | ICD-10-CM | POA: Insufficient documentation

## 2019-10-20 DIAGNOSIS — X58XXXA Exposure to other specified factors, initial encounter: Secondary | ICD-10-CM | POA: Insufficient documentation

## 2019-10-20 DIAGNOSIS — M79622 Pain in left upper arm: Secondary | ICD-10-CM | POA: Insufficient documentation

## 2019-10-20 DIAGNOSIS — Y939 Activity, unspecified: Secondary | ICD-10-CM | POA: Insufficient documentation

## 2019-10-20 DIAGNOSIS — S00511A Abrasion of lip, initial encounter: Secondary | ICD-10-CM | POA: Insufficient documentation

## 2019-10-20 MED ORDER — LIDOCAINE 5 % EX PTCH
1.0000 | MEDICATED_PATCH | Freq: Every day | CUTANEOUS | 0 refills | Status: DC | PRN
Start: 2019-10-20 — End: 2019-10-27

## 2019-10-20 MED ORDER — MELOXICAM 15 MG PO TABS: 15.0000 mg | ORAL_TABLET | Freq: Every day | ORAL | 0 refills | Status: DC | PRN

## 2019-10-20 NOTE — Discharge Instructions (Addendum)
Please read and follow all provided instructions.  You have been seen today for left upper arm pain.   Tests performed today include: An x-ray of the affected area - does NOT show any broken bones or dislocations.  Vital signs. See below for your results today.   Home care instructions: -- *PRICE in the first 24-48 hours: Protect (with brace, splint, sling), if given by your provider Rest Ice- Do not apply ice pack directly to your skin, place towel or similar between your skin and ice/ice pack. Apply ice for 20 min, then remove for 40 min while awake Compression- Wear brace, elastic bandage, splint as directed by your provider Elevate affected extremity above the level of your heart when not walking around for the first 24-48 hours   Medications:  Meloxicam- is a nonsteroidal anti-inflammatory medication that will help with pain and swelling. Be sure to take this medication as prescribed with food, 1 pill every 24 hours,  It should be taken with food, as it can cause stomach upset, and more seriously, stomach bleeding. Do not take other nonsteroidal anti-inflammatory medications with this such as Advil, Motrin, Aleve, Mobic, naproxen, Goodie Powder, or Motrin, etc.    - Lidoderm patch- apply 1 patch to area of most significant pain once per day, remove within 12 hours.   You make take Tylenol per over the counter dosing with these medications.   We have prescribed you new medication(s) today. Discuss the medications prescribed today with your pharmacist as they can have adverse effects and interactions with your other medicines including over the counter and prescribed medications. Seek medical evaluation if you start to experience new or abnormal symptoms after taking one of these medicines, seek care immediately if you start to experience difficulty breathing, feeling of your throat closing, facial swelling, or rash as these could be indications of a more serious allergic  reaction   Follow-up instructions: Please follow-up with your primary care provider or the provided orthopedic physician (bone specialist) if you continue to have significant pain in 1 week. In this case you may have a more severe injury that requires further care.   Return instructions:  Please return if your digits or extremity are numb or tingling, appear gray or blue, or you have severe pain (also elevate the extremity and loosen splint or wrap if you were given one) Please return if you have redness or fevers.  Please return to the Emergency Department if you experience worsening symptoms.  Please return if you have any other emergent concerns. Additional Information:  Your vital signs today were: BP (!) 134/35 (BP Location: Right Arm)   Pulse 90   Temp 98.3 F (36.8 C) (Oral)   Resp 18   Ht 5' 7.5" (1.715 m)   Wt 75.8 kg   SpO2 97%   BMI 25.77 kg/m  If your blood pressure (BP) was elevated above 135/85 this visit, please have this repeated by your doctor within one month. ---------------

## 2019-10-20 NOTE — ED Provider Notes (Signed)
[facility] EMERGENCY DEPARTMENT Provider Note   CSN: [id number] Arrival date & time: 10/20/19  1236     History Chief Complaint  Patient presents with  . Shoulder Pain    [name] is a 59 y.o. female with a history of diabetes mellitus who presents to the emergency department with complaints of left upper arm pain S/p syncopal episodes 10/17/19. Patient had a syncopal episode after using the commode, came to the ED for assessment at that time, had thorough work-up which was overall reassuring and was discharged home with norco & robaxin for pain associated with injuries. She states that she has been taking these medications intermittently as well as ibuprofen, took robaxin with the norco this morning for the first time together and got some relief. States pain is constant, worse with movement. She denies recurrent injury. Denies fever, chills, numbness, weakness, or open wounds.   HPI     Past Medical History:  Diagnosis Date  . Diabetes mellitus without complication (HCC)     There are no problems to display for this patient.   Past Surgical History:  Procedure Laterality Date  . ANKLE FRACTURE SURGERY Right      OB History   No obstetric history on file.     History reviewed. No pertinent family history.  Social History   Tobacco Use  . Smoking status: Current Every Day Smoker    Packs/day: 0.50  . Smokeless tobacco: Never Used  Vaping Use  . Vaping Use: Never used  Substance Use Topics  . Alcohol use: Not Currently  . Drug use: Not Currently    Home Medications Prior to Admission medications   Medication Sig Start Date End Date Taking? Authorizing Provider  Calcium Carbonate Antacid (ANTACID PO) Take 1 tablet by mouth daily.    [provider]  diphenhydrAMINE (BENADRYL ALLERGY) 25 MG tablet Take 50 mg by mouth 2 (two) times daily.    [provider]  Doxylamine Succinate, Sleep, (SLEEP AID PO) Take 2 tablets by mouth daily.     [provider]  HYDROcodone-acetaminophen (NORCO/VICODIN) 5-325 MG tablet Take one tab po q 4 hrs prn pain 10/17/19   Triplett, Tammy, PA-C  KRILL OIL PO Take 2 tablets by mouth 2 (two) times daily.    [provider]  lisinopril (ZESTRIL) 10 MG tablet Take 10 mg by mouth daily. 10/04/19   [provider]  loratadine (CLARITIN) 10 MG tablet Take 10 mg by mouth daily.    [provider]  metFORMIN (GLUCOPHAGE) 1000 MG tablet Take 1,000 mg by mouth 2 (two) times daily. 06/20/19   [provider]  methocarbamol (ROBAXIN) 500 MG tablet Take 1 tablet (500 mg total) by mouth 3 (three) times daily. 10/17/19   Triplett, Tammy, PA-C  Multiple Vitamin (MULTIVITAMIN ADULT PO) Take 1 tablet by mouth.    [provider]  simvastatin (ZOCOR) 20 MG tablet Take 20 mg by mouth at bedtime. 08/04/19   [provider]  traZODone (DESYREL) 100 MG tablet Take 50-100 mg by mouth at bedtime as needed. 10/01/19   [provider]    Allergies    Patient has no known allergies.  Review of Systems   Review of Systems  Constitutional: Negative for chills and fever.  Respiratory: Negative for cough and shortness of breath.   Cardiovascular: Negative for chest pain.  Gastrointestinal: Negative for abdominal pain.  Musculoskeletal: Positive for myalgias.  Skin: Negative for color change and wound.  Neurological: Negative  for weakness and numbness.    Physical Exam Updated Vital Signs BP (!) 134/35 (BP Location: Right Arm)   Pulse 90   Temp 98.3 F (36.8 C) (Oral)   Resp 18   Ht 5' 7.5" (1.715 m)   Wt 75.8 kg   SpO2 97%   BMI 25.77 kg/m   Physical Exam Vitals and nursing note reviewed.  Constitutional:      General: She is not in acute distress.    Appearance: Normal appearance. She is well-developed. She is not ill-appearing or toxic-appearing.  HENT:     Head: Normocephalic.     Mouth/Throat:     Comments: Abrasion to left upper lip  present with scabbing.  Eyes:     General:        Right eye: No discharge.        Left eye: No discharge.     Conjunctiva/sclera: Conjunctivae normal.  Neck:     Comments: No midline tenderness.  Cardiovascular:     Rate and Rhythm: Normal rate and regular rhythm.     Pulses:          Radial pulses are 2+ on the right side and 2+ on the left side.  Pulmonary:     Effort: Pulmonary effort is normal. No respiratory distress.     Breath sounds: Normal breath sounds. No wheezing, rhonchi or rales.  Abdominal:     General: There is no distension.     Palpations: Abdomen is soft.     Tenderness: There is no abdominal tenderness.  Musculoskeletal:     Cervical back: Normal range of motion and neck supple.     Comments: Upper extremities: No obvious deformity, appreciable swelling, edema, erythema, ecchymosis, warmth, or open wounds. Patient has intact AROM throughout.  Tender to palpation to the L proximal 2/3rds of the humerus. Otherwise nontender. No glenohumeral joint tenderness.    Skin:    General: Skin is warm and dry.     Capillary Refill: Capillary refill takes less than 2 seconds.     Findings: No rash.  Neurological:     Mental Status: She is alert.     Comments: Alert. Clear speech. Sensation grossly intact to bilateral upper extremities. 5/5 symmetric grip strength. Ambulatory. Able to perform OK sign, thumbs up, and cross 2nd/3rd digits bilaterally.   Psychiatric:        Mood and Affect: Mood normal.        Behavior: Behavior normal.     ED Results / Procedures / Treatments   Labs (all labs ordered are listed, but only abnormal results are displayed) Labs Reviewed - No data to display  EKG None  Radiology DG Humerus Left  Result Date: 10/20/2019 CLINICAL DATA:  Arm pain, fall EXAM: LEFT HUMERUS - 2+ VIEW COMPARISON:  None. FINDINGS: There is no evidence of fracture or other focal bone lesions. Soft tissues are unremarkable. IMPRESSION: Negative. Electronically  Signed   By: Helyn Numbers MD   On: 10/20/2019 15:46    Procedures Procedures (including critical care time)  Medications Ordered in ED Medications - No data to display  ED Course  I have reviewed the triage vital signs and the nursing notes.  Pertinent labs & imaging results that were available during my care of the patient were reviewed by me and considered in my medical decision making (see chart for details).    MDM Rules/Calculators/A&P  Patient presents to the ED with complaints of L upper arm pain S/p injury 10/17/19. Patient is nontoxic, resting comfortably. Vitals without significant abnormality.   Additional history obtained:  Additional history obtained from chart & nursing note review. Previous records obtained and reviewed.   Imaging Studies ordered:  I ordered imaging studies which included L humerus x-ray, I independently visualized and interpreted imaging which showed no acute process.   X-ray without fracture or dislocation.  No erythema/warmth/fevers to indicate infectious process such as cellulitis.  Suspect muscular pain. Will treat with meloxicam & lidoderm patch, last renal function WNL.Orthopedics follow up provided. I discussed results, treatment plan, need for follow-up, and return precautions with the patient. Provided opportunity for questions, patient confirmed understanding and is in agreement with plan.    Portions of this note were generated with Scientist, clinical (histocompatibility and immunogenetics). Dictation errors may occur despite best attempts at proofreading.  Final Clinical Impression(s) / ED Diagnoses Final diagnoses:  Left upper arm pain    Rx / DC Orders ED Discharge Orders         Ordered    meloxicam (MOBIC) 15 MG tablet  Daily PRN     Discontinue  Reprint     10/20/19 1553    lidocaine (LIDODERM) 5 %  Daily PRN     Discontinue  Reprint     10/20/19 1553           Akeiba Axelson, Pleas Koch, PA-C 10/20/19 1555    Benjiman Core,  MD 10/20/19 1617

## 2019-10-20 NOTE — ED Triage Notes (Signed)
Pt reports was seen for same on Tuesday. Pt reports was given muscle relaxers and reports took two at a time this am and reports no change in pain. Pt reports "I dont know if I need something stronger or what but im still in pain." pt denies any new re-injury since Tuesday.

## 2019-10-27 ENCOUNTER — Encounter: Payer: Self-pay | Admitting: Orthopedic Surgery

## 2019-10-27 ENCOUNTER — Ambulatory Visit: Payer: Self-pay | Admitting: Orthopedic Surgery

## 2019-10-27 ENCOUNTER — Other Ambulatory Visit: Payer: Self-pay

## 2019-10-27 VITALS — BP 183/102 | HR 102 | Ht 67.0 in | Wt 167.0 lb

## 2019-10-27 DIAGNOSIS — M9971 Connective tissue and disc stenosis of intervertebral foramina of cervical region: Secondary | ICD-10-CM

## 2019-10-27 DIAGNOSIS — M4802 Spinal stenosis, cervical region: Secondary | ICD-10-CM

## 2019-10-27 MED ORDER — METHOCARBAMOL 750 MG PO TABS: 750.0000 mg | ORAL_TABLET | Freq: Four times a day (QID) | ORAL | 2 refills | Status: DC

## 2019-10-27 MED ORDER — HYDROCODONE-ACETAMINOPHEN 5-325 MG PO TABS: 1.0000 | ORAL_TABLET | Freq: Four times a day (QID) | ORAL | 0 refills | Status: AC | PRN

## 2019-10-27 MED ORDER — LIDOCAINE 5 % EX PTCH: 1.0000 | MEDICATED_PATCH | Freq: Every day | CUTANEOUS | 0 refills | Status: DC | PRN

## 2019-10-27 NOTE — Progress Notes (Signed)
NEW PROBLEM//OFFICE VISIT  Chief Complaint  Patient presents with  . 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    Past Medical History:  Diagnosis Date  . Diabetes mellitus without complication [facility])     Past Surgical History:  Procedure Laterality Date  . ANKLE FRACTURE SURGERY Right     Family History  Problem Relation Age of Onset  . Diabetes Father    Social History   Tobacco Use  . Smoking status: Current Every Day Smoker    Packs/day: 0.50  . Smokeless tobacco: Never Used  Vaping Use  . Vaping Use: Never used  Substance Use Topics  . Alcohol use: Not Currently  . Drug use: Not Currently    No Known Allergies  Current Meds  Medication Sig  . Calcium Carbonate Antacid (ANTACID PO) Take 1 tablet by mouth daily.  . diphenhydrAMINE (BENADRYL ALLERGY) 25 MG tablet Take 50 mg by mouth 2 (two) times daily.  . Doxylamine Succinate, Sleep, (SLEEP AID PO) Take 2 tablets by mouth daily.  Marland Kitchen KRILL OIL PO Take 2 tablets by mouth 2 (two) times daily.  Marland Kitchen lisinopril (ZESTRIL) 10 MG tablet Take 10 mg by mouth daily.  Marland Kitchen loratadine (CLARITIN) 10 MG tablet Take 10 mg by mouth daily.  . meloxicam (MOBIC) 15  MG tablet Take 1 tablet (15 mg total) by mouth daily as needed for pain.  . metFORMIN (GLUCOPHAGE) 1000 MG tablet Take 1,000 mg by mouth 2 (two) times daily.  . Multiple Vitamin (MULTIVITAMIN ADULT PO) Take 1 tablet by mouth.  . simvastatin (ZOCOR) 20 MG tablet Take 20 mg by mouth at bedtime.  . [DISCONTINUED] HYDROcodone-acetaminophen (NORCO/VICODIN) 5-325 MG tablet Take one tab po q 4 hrs prn pain  . HYDROcodone-acetaminophen (NORCO/VICODIN) 5-325 MG tablet Take 1 tablet by mouth every 6 (six) hours as needed for up to 5 days for moderate pain. Take one tab po q 4 hrs prn pain    BP (!) 183/102   Pulse 102   Ht 5\' 7"  (1.702 m)   Wt 167 lb (75.8 kg)   BMI 26.16 kg/m   Physical Exam Constitutional:      General: She is not in acute distress.    Appearance: She is well-developed.  Cardiovascular:     Comments: No peripheral edema Skin:    General: Skin is warm and dry.  Neurological:     Mental Status: She is alert and oriented to person, place, and time.     Sensory: No sensory deficit.     Coordination: Coordination normal.     Gait: Gait  normal.     Deep Tendon Reflexes: Reflexes are normal and symmetric.     Ortho Exam  C5 tenderness C5-7 and also medial border left scapula.  The shoulder is essentially nontender and has full range of motion with minimal discomfort  The lateral border of the deltoid has a sensory loss compared to the right side she also has a tingling sensation and decreased sensation left thumb compared to right  C5-T1 motor exam is normal bilaterally  That she has an asymmetric reflex left biceps 0 out of 5 normal on the right  Decreased range of motion and painful range of motion in the cervical spine as well     MEDICAL DECISION MAKING  A.  Encounter Diagnoses  Name Primary?  . 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 Foraminal stenosis of cervical region     B. DATA ANALYSED:  CT scan C5-6 hard disc with some extension to C4  and C7 left foraminal stenosis  IMAGING: Independent interpretation of images: See statement above   left humerus AP lateral normal  Outside records reviewed: ER  C. MANAGEMENT  I am going to refill some of her medications mainly the Lidoderm patch Robaxin increase the dose to 750 and refill the Norco 5 mg she is on meloxicam 15 mg a day  She needs a neurosurgical consult primarily based on the C5-6 hard disc with moderately severe stenosis in the asymmetric reflex    Meds ordered this encounter  Medications  . HYDROcodone-acetaminophen (NORCO/VICODIN) 5-325 MG tablet    Sig: Take 1 tablet by mouth every 6 (six) hours as needed for up to 5 days for moderate pain. Take one tab po q 4 hrs prn pain    Dispense:  20 tablet    Refill:  0  . methocarbamol (ROBAXIN) 750 MG tablet    Sig: Take 1 tablet (750 mg total) by mouth 4 (four) times daily.    Dispense:  60 tablet    Refill:  2  . lidocaine (LIDODERM) 5 %    Sig: Place 1 patch onto the skin daily as needed. Apply patch to area most significant pain once per day.  Remove and discard patch within 12 hours of application.    Dispense:  30 patch    Refill:  0      Fuller Canada, MD  10/27/2019 10:46 AM

## 2019-10-27 NOTE — Patient Instructions (Addendum)
We will refer you to the Neurosurgeon for evaluation of your neck You will get a call from their office They are in [place]. [phone] and you can call them to schedule, it may speed up the process.   Use Aspercreme, Biofreeze or Voltaren gel over the counter 2-3 times daily make sure you rub it in well each time you use it.

## 2019-10-30 ENCOUNTER — Telehealth: Payer: Self-pay | Admitting: Orthopedic Surgery

## 2019-10-30 NOTE — Telephone Encounter (Signed)
Fax received from [facility] in [place], request clarification on dosage: "SIG 1 Q6HPRN or 1 Q4HPRN" / ph#[phone]/Fax [phone] (hard copy in box if needed)

## 2019-10-30 NOTE — Telephone Encounter (Signed)
Take 1 tablet by mouth every 6 (six) hours as needed for up to 5 days for moderate pain. Take one tab po q 4 hrs prn pain Is you sig  Do you want her to use 1 q 6 or 1 q 4 ?

## 2019-10-30 NOTE — Telephone Encounter (Signed)
???   I q 4

## 2019-10-31 NOTE — Telephone Encounter (Signed)
Relayed to pharmacist [facility], per Dr Mort Sawyers response. Patient made aware as well.

## 2019-10-31 NOTE — Telephone Encounter (Signed)
I called pharmacy could not leave message They open at 9.

## 2019-11-03 ENCOUNTER — Other Ambulatory Visit: Payer: Self-pay | Admitting: Orthopedic Surgery

## 2019-11-03 ENCOUNTER — Telehealth: Payer: Self-pay | Admitting: Orthopedic Surgery

## 2019-11-03 MED ORDER — GABAPENTIN 300 MG PO CAPS
300.0000 mg | ORAL_CAPSULE | Freq: Three times a day (TID) | ORAL | 5 refills | Status: DC
Start: 2019-11-03 — End: 2022-08-18

## 2019-11-03 NOTE — Telephone Encounter (Signed)
I have advised her.  She has had to delay her appointment with Neurosurgery due to finances She states she is going out of town on Saturday next week and will need a refill of the Hydrocodone on Monday or Tuesday Wants to know if you will refill it for her then

## 2019-11-03 NOTE — Telephone Encounter (Signed)
Patient called stating that she doesn't think the mgs on the Hydrocodone was strong enough.  She states she is still having a lot of pain.  She wants something stronger or wants to know if she needs to come in to the office to be seen.   She states she uses [facility]  Please advise  Thanks

## 2019-11-03 NOTE — Telephone Encounter (Signed)
Thanks I called her to advise.  

## 2019-11-03 NOTE — Telephone Encounter (Signed)
denied °

## 2019-11-03 NOTE — Telephone Encounter (Signed)
I sent in some gabapentin

## 2019-12-12 ENCOUNTER — Other Ambulatory Visit (HOSPITAL_COMMUNITY): Payer: Self-pay | Admitting: *Deleted

## 2019-12-12 DIAGNOSIS — Z1231 Encounter for screening mammogram for malignant neoplasm of breast: Secondary | ICD-10-CM

## 2019-12-20 ENCOUNTER — Ambulatory Visit (HOSPITAL_COMMUNITY)
Admission: RE | Admit: 2019-12-20 | Discharge: 2019-12-20 | Disposition: A | Discharge status: Home / Self Care | Payer: PRIVATE HEALTH INSURANCE | Source: Ambulatory Visit | Attending: *Deleted | Admitting: *Deleted

## 2019-12-20 ENCOUNTER — Other Ambulatory Visit: Payer: Self-pay

## 2019-12-20 DIAGNOSIS — Z1231 Encounter for screening mammogram for malignant neoplasm of breast: Secondary | ICD-10-CM | POA: Insufficient documentation

## 2019-12-22 ENCOUNTER — Other Ambulatory Visit (HOSPITAL_COMMUNITY): Payer: Self-pay | Admitting: *Deleted

## 2019-12-22 DIAGNOSIS — N6489 Other specified disorders of breast: Secondary | ICD-10-CM

## 2020-01-04 ENCOUNTER — Ambulatory Visit (HOSPITAL_COMMUNITY)
Admission: RE | Admit: 2020-01-04 | Discharge: 2020-01-04 | Disposition: A | Discharge status: Home / Self Care | Payer: PRIVATE HEALTH INSURANCE | Source: Ambulatory Visit | Attending: *Deleted | Admitting: *Deleted

## 2020-01-04 ENCOUNTER — Other Ambulatory Visit: Payer: Self-pay

## 2020-01-04 DIAGNOSIS — N6489 Other specified disorders of breast: Secondary | ICD-10-CM

## 2020-01-16 ENCOUNTER — Other Ambulatory Visit (HOSPITAL_COMMUNITY): Payer: PRIVATE HEALTH INSURANCE

## 2020-01-16 ENCOUNTER — Encounter (HOSPITAL_COMMUNITY): Payer: PRIVATE HEALTH INSURANCE

## 2020-08-26 ENCOUNTER — Other Ambulatory Visit (HOSPITAL_COMMUNITY): Payer: Self-pay | Admitting: *Deleted

## 2020-08-26 DIAGNOSIS — Z1231 Encounter for screening mammogram for malignant neoplasm of breast: Secondary | ICD-10-CM

## 2021-01-06 ENCOUNTER — Ambulatory Visit (HOSPITAL_COMMUNITY)
Admission: RE | Admit: 2021-01-06 | Discharge: 2021-01-06 | Disposition: A | Discharge status: Home / Self Care | Payer: Self-pay | Source: Ambulatory Visit | Attending: *Deleted | Admitting: *Deleted

## 2021-01-06 ENCOUNTER — Other Ambulatory Visit: Payer: Self-pay

## 2021-01-06 DIAGNOSIS — Z1231 Encounter for screening mammogram for malignant neoplasm of breast: Secondary | ICD-10-CM | POA: Insufficient documentation

## 2022-01-28 ENCOUNTER — Other Ambulatory Visit: Payer: Self-pay | Admitting: *Deleted

## 2022-01-28 DIAGNOSIS — Z1231 Encounter for screening mammogram for malignant neoplasm of breast: Secondary | ICD-10-CM

## 2022-02-16 ENCOUNTER — Ambulatory Visit (INDEPENDENT_AMBULATORY_CARE_PROVIDER_SITE_OTHER): Payer: Self-pay | Admitting: Orthopedic Surgery

## 2022-02-16 ENCOUNTER — Other Ambulatory Visit: Payer: Self-pay | Admitting: Radiology

## 2022-02-16 ENCOUNTER — Encounter: Payer: Self-pay | Admitting: Orthopedic Surgery

## 2022-02-16 VITALS — BP 181/96 | HR 83 | Ht 67.0 in | Wt 157.0 lb

## 2022-02-16 DIAGNOSIS — M25512 Pain in left shoulder: Secondary | ICD-10-CM

## 2022-02-16 DIAGNOSIS — M7542 Impingement syndrome of left shoulder: Secondary | ICD-10-CM

## 2022-02-16 MED ORDER — MELOXICAM 15 MG PO TABS: 15.0000 mg | ORAL_TABLET | Freq: Every day | ORAL | 0 refills | Status: DC

## 2022-02-16 MED ORDER — METHYLPREDNISOLONE ACETATE 40 MG/ML IJ SUSP
40.0000 mg | Freq: Once | INTRAMUSCULAR | Status: AC
  Administered 2022-02-16: 40 mg via INTRA_ARTICULAR

## 2022-02-16 MED ORDER — ACETAMINOPHEN-CODEINE 300-30 MG PO TABS: 1.0000 | ORAL_TABLET | Freq: Four times a day (QID) | ORAL | 0 refills | Status: DC | PRN

## 2022-02-16 MED ORDER — METHOCARBAMOL 750 MG PO TABS: 750.0000 mg | ORAL_TABLET | Freq: Three times a day (TID) | ORAL | 0 refills | Status: DC | PRN

## 2022-02-16 NOTE — Progress Notes (Signed)
Chief Complaint  Patient presents with   Arm Pain    Left was referred to neurosurgery 2021 but states this pain is new/ different from then     Established patient 61 year old female presents with left shoulder pain around the shoulder joint and in the left scapula.  The patient thinks she pulled a muscle 6 to 8 months ago and then had a fall onto the left shoulder a month or 2 after that.  She has been to urgent care a couple of times she has had some anti-inflammatory medication but no therapy or injection  She now complains of left shoulder pain around the joint and left scapular pain   her strength is not decreased  She has increased pain at night around the scapula has some decreased internal rotation  Past Medical History:  Diagnosis Date   Diabetes mellitus without complication (HCC)    Past Surgical History:  Procedure Laterality Date   ANKLE FRACTURE SURGERY Right    Meds ordered this encounter  Medications   methylPREDNISolone acetate (DEPO-MEDROL) injection 40 mg   methylPREDNISolone acetate (DEPO-MEDROL) injection 40 mg   acetaminophen-codeine (TYLENOL #3) 300-30 MG tablet    Sig: Take 1 tablet by mouth every 6 (six) hours as needed for moderate pain.    Dispense:  30 tablet    Refill:  0   BP (!) 181/96   Pulse 83   Ht 5\' 7"  (1.702 m)   Wt 157 lb (71.2 kg)   BMI 24.59 kg/m   Physical Exam Musculoskeletal:     Left shoulder: Tenderness present. No swelling, deformity, effusion, laceration or crepitus. Decreased range of motion. Normal strength. Normal pulse.     Comments: Signs for impingement positive at 120  Internal rotation deficit to the belt buckle  Forward elevation pain at 120 but overall forward elevation full no cuff strength deficit  Addendum patient complains of numbness in the thumb and index finger has a history of cervical disc disease seen by neurosurgery    As part of the evaluation we reviewed AP lateral and axillary views of the left  shoulder there is no fracture or dislocation   Meds ordered this encounter  Medications   methylPREDNISolone acetate (DEPO-MEDROL) injection 40 mg   methylPREDNISolone acetate (DEPO-MEDROL) injection 40 mg   acetaminophen-codeine (TYLENOL #3) 300-30 MG tablet    Sig: Take 1 tablet by mouth every 6 (six) hours as needed for moderate pain.    Dispense:  30 tablet    Refill:  0    Encounter Diagnoses  Name Primary?   Trigger point of left shoulder region Yes   Impingement syndrome of left shoulder     I think the primary problem is the impingement in the trigger point so we gave an injection in the subacromial space and at the trigger point  Procedure note the subacromial injection shoulder left   Verbal consent was obtained to inject the  Left   Shoulder  Timeout was completed to confirm the injection site is a subacromial space of the  left  shoulder  Medication used Depo-Medrol 40 mg and lidocaine 1% 3 cc  Anesthesia was provided by ethyl chloride  The injection was performed in the left  posterior subacromial space. After pinning the skin with alcohol and anesthetized the skin with ethyl chloride the subacromial space was injected using a 20-gauge needle. There were no complications  Sterile dressing was applied.     TRIGGER POINT INJECTION  Patient consented verbally for  injection of the left posterior/MEDIAL scapula. Timeout confirmed the site of injection A steroid injection was performed at inferior border of the left scapula at the point of maximal tenderness using 1% plain Lidocaine and 40 mg of Depo-Medrol. This was well tolerated.     She should start Codman exercises limit any weightbearing or weighted shoulder exercises   Meds ordered this encounter  Medications   methylPREDNISolone acetate (DEPO-MEDROL) injection 40 mg   methylPREDNISolone acetate (DEPO-MEDROL) injection 40 mg   acetaminophen-codeine (TYLENOL #3) 300-30 MG tablet    Sig: Take 1 tablet  by mouth every 6 (six) hours as needed for moderate pain.    Dispense:  30 tablet    Refill:  0

## 2022-02-16 NOTE — Telephone Encounter (Signed)
Your instructions told her to continue Methocarbamol and Meloxicam  My med review indicated she has not been taking these medications, I took them out of her list  If you would like for her to use them she will need them send in to her pharmacy please

## 2022-02-16 NOTE — Patient Instructions (Addendum)
  You have received an injection of steroids into the joint. 15% of patients will have increased pain within the 24 hours postinjection.   This is transient and will go away.   We recommend that you use ice packs on the injection site for 20 minutes every 2 hours and extra strength Tylenol 2 tablets every 8 as needed until the pain resolves.  If you continue to have pain after taking the Tylenol and using the ice please call the office for further instructions.   Continue meloxicam robaxin and neurontin  You can have 5 days of opioids but then use tylenol 500 mg every 6 hrs for pain

## 2022-02-18 ENCOUNTER — Ambulatory Visit (HOSPITAL_COMMUNITY)
Admission: RE | Admit: 2022-02-18 | Discharge: 2022-02-18 | Disposition: A | Discharge status: Home / Self Care | Payer: PRIVATE HEALTH INSURANCE | Source: Ambulatory Visit | Attending: *Deleted | Admitting: *Deleted

## 2022-02-18 DIAGNOSIS — Z1231 Encounter for screening mammogram for malignant neoplasm of breast: Secondary | ICD-10-CM | POA: Insufficient documentation

## 2022-02-19 ENCOUNTER — Encounter: Payer: PRIVATE HEALTH INSURANCE | Admitting: Orthopedic Surgery

## 2022-05-21 ENCOUNTER — Other Ambulatory Visit: Payer: Self-pay | Admitting: Orthopedic Surgery

## 2022-05-21 DIAGNOSIS — M7542 Impingement syndrome of left shoulder: Secondary | ICD-10-CM

## 2022-05-21 DIAGNOSIS — M25512 Pain in left shoulder: Secondary | ICD-10-CM

## 2022-05-21 NOTE — Telephone Encounter (Signed)
Patient presented to the office requesting a refill on Acetaminophen-codeine 65m, 30 quantity, 1 every 6 hours [facility]  Pt's # [phone]

## 2022-08-18 ENCOUNTER — Other Ambulatory Visit: Payer: Self-pay

## 2022-08-18 ENCOUNTER — Ambulatory Visit (INDEPENDENT_AMBULATORY_CARE_PROVIDER_SITE_OTHER): Payer: Medicaid Other | Admitting: Internal Medicine

## 2022-08-18 ENCOUNTER — Encounter: Payer: Self-pay | Admitting: Internal Medicine

## 2022-08-18 VITALS — BP 160/80 | HR 95 | Temp 98.7°F | Resp 16 | Ht 66.14 in | Wt 164.7 lb

## 2022-08-18 DIAGNOSIS — J3089 Other allergic rhinitis: Secondary | ICD-10-CM

## 2022-08-18 DIAGNOSIS — B369 Superficial mycosis, unspecified: Secondary | ICD-10-CM

## 2022-08-18 DIAGNOSIS — R21 Rash and other nonspecific skin eruption: Secondary | ICD-10-CM

## 2022-08-18 MED ORDER — DESONIDE 0.05 % EX CREA: TOPICAL_CREAM | CUTANEOUS | 5 refills | Status: DC

## 2022-08-18 MED ORDER — FLUCONAZOLE 150 MG PO TABS: 150.0000 mg | ORAL_TABLET | ORAL | 0 refills | Status: DC

## 2022-08-18 MED ORDER — TRIAMCINOLONE ACETONIDE 0.1 % EX OINT: TOPICAL_OINTMENT | CUTANEOUS | 1 refills | Status: AC

## 2022-08-18 NOTE — Patient Instructions (Addendum)
Dermatitis - Unclear etiology.  Possibly fungal in setting of flaking and some questionable satellite lesions.   - Only new medication is Estroven (supplement), not sure how that could cause a drug reaction.  Will check CBC and CMP just to be certain as she has a peeling rash.  Discussed holding Estroven.  - Do a daily soaking tub bath in warm water for 10-15 minutes.  - Use a gentle, unscented cleanser at the end of the bath (such as Dove unscented bar or baby wash, or Aveeno sensitive body wash). Then rinse, pat half-way dry, and apply a gentle, unscented moisturizer cream or ointment (Cerave, Cetaphil, Eucerin, Aveeno)  all over while still damp. Dry skin makes the itching and rash worse. The skin should be moisturized with a gentle, unscented moisturizer at least twice daily.  - Use only unscented liquid laundry detergent. - Apply prescribed topical steroid (triamcinolone 0.1% below face or desonide 0.05% above face) to flared areas (red and thickened eczema) after the moisturizer has soaked into the skin (wait at least 30 minutes). Taper off the topical steroids as the skin improves. Do not use topical steroid for more than 7-10 days at a time.  - Will refer you to Dermatology.  - Use Zyrtec (Cetirizine) 10mg  twice daily.  - Use hydroxyzine 25mg  nightly for itching.  - Use Fluconazole 150mg  once a week for 2 doses.    Chronic Rhinitis - Will consider aeroallergen testing when her skin is not flared up.  - Use nasal saline rinses before nose sprays such as with Neilmed Sinus Rinse.  Use distilled water.   - Use Zyrtec 10 mg daily.

## 2022-08-18 NOTE — Progress Notes (Signed)
NEW PATIENT  Date of Service/Encounter:  08/18/22  Consult requested by: Tylene Fantasia., PA-C   Subjective:   [name] (DOB: [birth year]) is a 62 y.o. female who presents to the clinic on 08/18/2022 with a chief complaint of Establish Care, Rash, and Allergic Reaction .    History obtained from: chart review and patient.   Rash: Has had a similar rash occur about four times.  She isn't sure what causes it.  Last flare up was in October. This started on Thursday with itching, redness and feeling of warm/puffy skin.  No hives/angioedema.  Then around Friday and Saturday, started noticing some peeling of face.   The day prior to its start, she did start Estroven supplement which she has since stopped.  No recent illness. No sun exposure/tanning Lives with other family members, no rashes.  She has been on simvastatin, HCTZ, metformin and trazodone for over 4 years.  Moisturizes with Cerave.  Uses native soap. Reports previously having a really bad rash with prednisone. Taking Zyrtec daily and hydroxyzine with minimal itch relief.    Rhinitis:  Started in childhood.  Symptoms include: nasal congestion, rhinorrhea, post nasal drainage, watery eyes, and itchy eyes  Occurs seasonally-Spring Potential triggers: not sure  Treatments tried:  Zyrtec daily  Previous allergy testing: no History of sinus surgery: no Nonallergic triggers: none  Past Medical History: Past Medical History:  Diagnosis Date   Diabetes mellitus without complication (HCC)    Past Surgical History: Past Surgical History:  Procedure Laterality Date   ANKLE FRACTURE SURGERY Right     Family History: Family History  Problem Relation Age of Onset   Diabetes Father     Social History:  Lives in a 41 year house Flooring in bedroom: carpet Pets: cat Tobacco use/exposure: current smoker since 1976, 0.5ppd Job: none  Medication List:  Allergies as of 08/18/2022       Reactions   Prednisone  Rash   Lisinopril Swelling        Medication List        Accurate as of Aug 18, 2022  3:24 PM. If you have any questions, ask your nurse or doctor.          STOP taking these medications    gabapentin 300 MG capsule Commonly known as: NEURONTIN Stopped by: Birder Robson, MD   meloxicam 15 MG tablet Commonly known as: MOBIC Stopped by: Birder Robson, MD   methocarbamol 750 MG tablet Commonly known as: ROBAXIN Stopped by: Birder Robson, MD       TAKE these medications    acetaminophen-codeine 300-30 MG tablet Commonly known as: TYLENOL #3 Take 1 tablet by mouth every 6 (six) hours as needed for moderate pain.   ANTACID PO Take 1 tablet by mouth daily.   Benadryl Allergy 25 MG tablet Generic drug: diphenhydrAMINE Take 50 mg by mouth 2 (two) times daily.   cetirizine 10 MG tablet Commonly known as: ZYRTEC Take 10 mg by mouth daily.   desonide 0.05 % cream Commonly known as: DesOwen Use twice daily for flare ups on face, maximum 10 days. Started by: Birder Robson, MD   EPINEPHrine 0.3 mg/0.3 mL Soaj injection Commonly known as: EPI-PEN Inject into the muscle.   fluconazole 150 MG tablet Commonly known as: DIFLUCAN Take 1 tablet (150 mg total) by mouth once a week. Started by: Birder Robson, MD   hydrochlorothiazide 12.5 MG tablet Commonly known as: HYDRODIURIL Take 12.5 mg  by mouth daily.   hydrOXYzine 25 MG tablet Commonly known as: ATARAX Take 25 mg by mouth 4 (four) times daily as needed.   KRILL OIL PO Take 2 tablets by mouth 2 (two) times daily.   loratadine 10 MG tablet Commonly known as: CLARITIN Take 10 mg by mouth daily.   metFORMIN 1000 MG tablet Commonly known as: GLUCOPHAGE Take 1,000 mg by mouth 2 (two) times daily.   MULTIVITAMIN ADULT PO Take 1 tablet by mouth.   simvastatin 20 MG tablet Commonly known as: ZOCOR Take 20 mg by mouth at bedtime.   SLEEP AID PO Take 2 tablets by mouth daily.   traZODone 100 MG  tablet Commonly known as: DESYREL Take 50-100 mg by mouth at bedtime as needed.   triamcinolone ointment 0.1 % Commonly known as: KENALOG Apply twice daily for flare ups below neck, maximum 10 days. Started by: Birder Robson, MD         REVIEW OF SYSTEMS: Pertinent positives and negatives discussed in HPI.   Objective:   Physical Exam: BP (!) 160/80   Pulse 95   Temp 98.7 F (37.1 C) (Temporal)   Resp 16   Ht 5' 6.14" (1.68 m)   Wt 164 lb 11.2 oz (74.7 kg)   SpO2 96%   BMI 26.47 kg/m  Body mass index is 26.47 kg/m. GEN: alert, well developed HEENT: clear conjunctiva, nose with + inferior turbinate hypertrophy, pink nasal mucosa, slight clear rhinorrhea, no cobblestoning HEART: regular rate and rhythm, no murmur LUNGS: clear to auscultation bilaterally, no coughing, unlabored respiration ABDOMEN: soft, non distended  SKIN: papular erythematous rash on face, neck, abdomen, groin creases, forearms.  Some possible satellite lesions. Skin flaking on bilateral cheeks.  Pictures in media tab.   Reviewed:  11/30/2020: seen in ED for facial swelling  with rash.  Thought to be allergic reaction to lisinopril and was stopped.  Rash is localized to R side (bumpy, raised, weeping). Thought to be contact dermatitis.  Given steroid taper and mupirocin ointment.  09/13/2021: seen in ED after a fall with acute L shoulder pain and chest wall pain.  Started on ibuprofen.  08/18/2022: seen by [facility] for allergic reaction to [place].  Had itchy rash and eye swelling.  Seen in urgent care and given hydroxyzine, reported allergy to prednisone.  Referred to Allergy.   Assessment:   1. Rash and nonspecific skin eruption   2. Fungal skin infection   3. Other allergic rhinitis     Plan/Recommendations:  Dermatitis - Unclear etiology.  Possibly fungal in setting of flaking and some questionable satellite lesions.   - Only new medication is Estroven (supplement), not sure how that  could cause a drug reaction.  Will check CBC and CMP just to be certain as she has a peeling rash.  Discussed holding Estroven.  - Do a daily soaking tub bath in warm water for 10-15 minutes.  - Use a gentle, unscented cleanser at the end of the bath (such as Dove unscented bar or baby wash, or Aveeno sensitive body wash). Then rinse, pat half-way dry, and apply a gentle, unscented moisturizer cream or ointment (Cerave, Cetaphil, Eucerin, Aveeno)  all over while still damp. Dry skin makes the itching and rash worse. The skin should be moisturized with a gentle, unscented moisturizer at least twice daily.  - Use only unscented liquid laundry detergent. - Apply prescribed topical steroid (triamcinolone 0.1% below face or desonide 0.05% above face) to flared areas (red  and thickened eczema) after the moisturizer has soaked into the skin (wait at least 30 minutes). Taper off the topical steroids as the skin improves. Do not use topical steroid for more than 7-10 days at a time.  - Will refer you to Dermatology.  - Use Zyrtec (Cetirizine) 10mg  twice daily.  - Use hydroxyzine 25mg  nightly for itching.  - Use Fluconazole 150mg  once a week for 2 doses.    Chronic Rhinitis - Will consider aeroallergen testing when her skin is not flared up.  - Use nasal saline rinses before nose sprays such as with Neilmed Sinus Rinse.  Use distilled water.   - Use Zyrtec 10 mg daily or twice daily.   Return in about 4 weeks (around 09/15/2022).  Alesia Morin, MD [facility]

## 2022-08-19 LAB — CBC WITH DIFFERENTIAL/PLATELET
Basophils Absolute: 0 10*3/uL (ref 0.0–0.2)
Basos: 0 %
EOS (ABSOLUTE): 1.1 10*3/uL — ABNORMAL HIGH (ref 0.0–0.4)
Eos: 12 %
Hematocrit: 40.1 % (ref 34.0–46.6)
Hemoglobin: 13.2 g/dL (ref 11.1–15.9)
Immature Grans (Abs): 0 10*3/uL (ref 0.0–0.1)
Immature Granulocytes: 0 %
Lymphocytes Absolute: 2 10*3/uL (ref 0.7–3.1)
Lymphs: 22 %
MCH: 29.1 pg (ref 26.6–33.0)
MCHC: 32.9 g/dL (ref 31.5–35.7)
MCV: 89 fL (ref 79–97)
Monocytes Absolute: 0.5 10*3/uL (ref 0.1–0.9)
Monocytes: 6 %
Neutrophils Absolute: 5.7 10*3/uL (ref 1.4–7.0)
Neutrophils: 60 %
Platelets: 327 10*3/uL (ref 150–450)
RBC: 4.53 x10E6/uL (ref 3.77–5.28)
RDW: 13.2 % (ref 11.7–15.4)
WBC: 9.4 10*3/uL (ref 3.4–10.8)

## 2022-08-19 LAB — CMP14+EGFR
ALT: 11 IU/L (ref 0–32)
AST: 14 IU/L (ref 0–40)
Albumin/Globulin Ratio: 1.9 (ref 1.2–2.2)
Albumin: 4.6 g/dL (ref 3.8–4.9)
Alkaline Phosphatase: 82 IU/L (ref 44–121)
BUN/Creatinine Ratio: 18 (ref 12–28)
BUN: 11 mg/dL (ref 8–27)
Bilirubin Total: 0.3 mg/dL (ref 0.0–1.2)
CO2: 25 mmol/L (ref 20–29)
Calcium: 10.3 mg/dL (ref 8.7–10.3)
Chloride: 101 mmol/L (ref 96–106)
Creatinine, Ser: 0.62 mg/dL (ref 0.57–1.00)
Globulin, Total: 2.4 g/dL (ref 1.5–4.5)
Glucose: 80 mg/dL (ref 70–99)
Potassium: 3.9 mmol/L (ref 3.5–5.2)
Sodium: 141 mmol/L (ref 134–144)
Total Protein: 7 g/dL (ref 6.0–8.5)
eGFR: 102 mL/min/{1.73_m2} (ref >59)

## 2022-09-09 ENCOUNTER — Telehealth: Payer: Self-pay | Admitting: Internal Medicine

## 2022-09-09 NOTE — Telephone Encounter (Signed)
Referral placed internally by Dr. Allena Katz to [facility].  Attempted to call patient to advise of referral & provide her with information to [facility], phone rang and rang & disconnected. Attempted to call again with same result. Unable to leave message.   [facility] [address] [place],  [place]  [address] Main: [phone] Fax: [phone]

## 2022-09-09 NOTE — Telephone Encounter (Signed)
-----   Message from Birder Robson, MD sent at 08/18/2022  3:37 PM EDT ----- Hello I would like for her to see Dermatology. Has had this peeling red rash about 4 times without a clear cause. Not hives/eczema.

## 2022-09-21 ENCOUNTER — Ambulatory Visit: Payer: Medicaid Other | Admitting: Internal Medicine

## 2022-09-28 ENCOUNTER — Ambulatory Visit: Payer: Medicaid Other | Admitting: Family Medicine

## 2022-10-05 ENCOUNTER — Other Ambulatory Visit: Payer: Self-pay

## 2022-10-05 ENCOUNTER — Ambulatory Visit: Payer: Medicaid Other | Admitting: Allergy & Immunology

## 2022-10-05 ENCOUNTER — Encounter: Payer: Self-pay | Admitting: Allergy & Immunology

## 2022-10-05 VITALS — BP 150/74 | HR 99 | Temp 98.6°F | Resp 16 | Wt 166.4 lb

## 2022-10-05 DIAGNOSIS — J302 Other seasonal allergic rhinitis: Secondary | ICD-10-CM

## 2022-10-05 DIAGNOSIS — L239 Allergic contact dermatitis, unspecified cause: Secondary | ICD-10-CM | POA: Diagnosis not present

## 2022-10-05 DIAGNOSIS — J3089 Other allergic rhinitis: Secondary | ICD-10-CM | POA: Diagnosis not present

## 2022-10-05 NOTE — Progress Notes (Signed)
FOLLOW UP  Date of Service/Encounter:  10/05/22   Assessment:   Perennial and seasonal allergic rhinitis (outdoor molds, dust mites, and cat)  Allergic dermatitis  Plan/Recommendations:   1. Allergic contact dermatitis - Continue with cetirizine 10 mg daily. - Continue to take pictures. - Allergy shots could help since these are likely related to dust mites and cat exposures.  - Continue with desonide as needed on the face and triamcinolone as needed on the rest of the body.  - Testing to the foods was completely negative, ruling out more than 95% of all food allergies.   2. Seasonal and perennial allergic rhinitis - Testing today showed: outdoor molds, dust mites, and cat - Copy of test results provided.  - Avoidance measures provided. - Continue with: Zyrtec (cetirizine) 10mg  tablet once daily - You can use an extra dose of the antihistamine, if needed, for breakthrough symptoms.  - Consider nasal saline rinses 1-2 times daily to remove allergens from the nasal cavities as well as help with mucous clearance (this is especially helpful to do before the nasal sprays are given) - We are going to take allergy shots as a means of long-term control. - Allergy shots "re-train" and "reset" the immune system to ignore environmental allergens and decrease the resulting immune response to those allergens (sneezing, itchy watery eyes, runny nose, nasal congestion, etc).    - Allergy shots improve symptoms in 75-85% of patients.   3. Return in about 6 months (around 04/07/2023). You can have the follow up appointment with Dr. Dellis Anes or a Nurse Practicioner (our Nurse Practitioners are excellent and always have Physician oversight!).   Subjective:   [name] is a 62 y.o. female presenting today for follow up of  Chief Complaint  Patient presents with   Follow-up   Allergy Testing    [name] has a history of the following: There are no problems to display for this  patient.   History obtained from: chart review and patient.  [name] is a 62 y.o. female presenting for skin testing.  She was last seen in May 2024.  At that time, Dr. Allena Katz felt that she might of had a fungal infection since she had some flaking skin.  She recommended moisturizing and the application of triamcinolone 0.1% ointment or desonide burning on the location of the body.  We referred her to dermatology and started Zyrtec twice a day and hydroxyzine at night.  She was started on Diflucan once a week for 2 doses.  Since last visit, she has done fairly well.  She tells me that she had a breakout a few weeks ago and had to cancel her appointment.  She now thinks that her skin is under fairly good control.  She does have some urticaria on her upper extremities.  Her hands look fairly clear.  She has not been using anything on a routine basis.  She has been on her antihistamines, but stopped them for this appointment.  She is not sure what is causing this. She would like testing to the environmentals and the most common foods.  Otherwise, there have been no changes to her past medical history, surgical history, family history, or social history.    Review of Systems  Constitutional: Negative.  Negative for chills, fever, malaise/fatigue and weight loss.  HENT:  Negative for congestion, ear discharge, ear pain and sinus pain.   Eyes:  Negative for pain, discharge and redness.  Respiratory:  Negative for cough,  sputum production, shortness of breath and wheezing.   Cardiovascular: Negative.  Negative for chest pain and palpitations.  Gastrointestinal:  Negative for abdominal pain, constipation, diarrhea, heartburn, nausea and vomiting.  Skin:  Positive for itching and rash.  Neurological:  Negative for dizziness and headaches.  Endo/Heme/Allergies:  Positive for environmental allergies. Does not bruise/bleed easily.       Objective:   Blood pressure (!) 150/74, pulse 99, temperature  98.6 F (37 C), resp. rate 16, weight 166 lb 6 oz (75.5 kg), SpO2 98 %. Body mass index is 26.74 kg/m.    Physical Exam Vitals reviewed.  Constitutional:      Appearance: She is well-developed.     Comments: Pleasant.   HENT:     Head: Normocephalic and atraumatic.     Right Ear: Tympanic membrane, ear canal and external ear normal.     Left Ear: Tympanic membrane, ear canal and external ear normal.     Nose: No nasal deformity, septal deviation, mucosal edema or rhinorrhea.     Right Turbinates: Enlarged and swollen.     Left Turbinates: Enlarged and swollen.     Right Sinus: No maxillary sinus tenderness or frontal sinus tenderness.     Left Sinus: No maxillary sinus tenderness or frontal sinus tenderness.     Mouth/Throat:     Mouth: Mucous membranes are not pale and not dry.     Pharynx: Uvula midline.  Eyes:     General: Lids are normal. No allergic shiner.       Right eye: No discharge.        Left eye: No discharge.     Conjunctiva/sclera: Conjunctivae normal.     Right eye: Right conjunctiva is not injected. No chemosis.    Left eye: Left conjunctiva is not injected. No chemosis.    Pupils: Pupils are equal, round, and reactive to light.  Cardiovascular:     Rate and Rhythm: Normal rate and regular rhythm.     Heart sounds: Normal heart sounds.  Pulmonary:     Effort: Pulmonary effort is normal. No tachypnea, accessory muscle usage or respiratory distress.     Breath sounds: Normal breath sounds. No wheezing, rhonchi or rales.  Chest:     Chest wall: No tenderness.  Lymphadenopathy:     Cervical: No cervical adenopathy.  Skin:    General: Skin is warm.     Capillary Refill: Capillary refill takes less than 2 seconds.     Coloration: Skin is not pale.     Findings: No abrasion, erythema, petechiae or rash. Rash is not papular, urticarial or vesicular.     Comments: She has some isolated urticaria on her bilateral upper extremities.  Neurological:     Mental  Status: She is alert.  Psychiatric:        Behavior: Behavior is cooperative.      Diagnostic studies:    Allergy Studies:     Airborne Adult Perc - 10/05/22 1355     Time Antigen Placed 1355    Allergen Manufacturer Waynette Buttery    Location Back    Number of Test 55    1. Control-Buffer 50% Glycerol Negative    2. Control-Histamine 2+    3. Bahia Negative    4. French Southern Territories Negative    5. Johnson Negative    6. Kentucky Blue Negative    7. Meadow Fescue Negative    8. Perennial Rye Negative    9. Timothy Negative    10.  Ragweed Mix Negative    11. Cocklebur Negative    12. Plantain,  English Negative    13. Baccharis Negative    14. Dog Fennel Negative    15. Russian Thistle Negative    16. Lamb's Quarters Negative    17. Sheep Sorrell Negative    18. Rough Pigweed Negative    19. Marsh Elder, Rough Negative    20. Mugwort, Common Negative    21. Box, Elder Negative    22. Cedar, red Negative    23. Sweet Gum Negative    24. Pecan Pollen Negative    25. Pine Mix Negative    26. Walnut, Black Pollen Negative    27. Red Mulberry Negative    28. Ash Mix Negative    29. Birch Mix Negative    30. Beech American Negative    31. Cottonwood, Guinea[place] Negative    32. Hickory, White Negative    33. Maple Mix Negative    34. Oak, [place] Mix Negative    35. Sycamore Eastern Negative    36. Alternaria Alternata Negative    37. Cladosporium Herbarum Negative    38. Aspergillus Mix Negative    39. Penicillium Mix Negative    40. Bipolaris Sorokiniana (Helminthosporium) Negative    41. Drechslera Spicifera (Curvularia) Negative    42. Mucor Plumbeus Negative    43. Fusarium Moniliforme Negative    44. Aureobasidium Pullulans (pullulara) Negative    45. Rhizopus Oryzae Negative    46. Botrytis Cinera Negative    47. Epicoccum Nigrum Negative    48. Phoma Betae Negative    49. Dust Mite Mix 4+    50. Cat Hair 10,000 BAU/ml Negative    51.  Dog Epithelia Negative    52. Mixed  Feathers Negative    54. Cockroach, German Negative    55. Tobacco Leaf Negative             Intradermal - 10/05/22 1417     Time Antigen Placed 0230    Allergen Manufacturer Waynette Buttery    Location Arm    Number of Test 15    Intradermal Select    Control Negative    Bahia Negative    French Southern Territories Negative    Johnson Negative    7 Grass Negative    Ragweed Mix Negative    Weed Mix Negative    Tree Mix Negative    Mold 1 Negative    Mold 2 Negative    Mold 3 3+    Mold 4 Negative    Cat 4+    Dog Negative    Cockroach Negative             Food Adult Perc - 10/05/22 1300     Time Antigen Placed 1356    Allergen Manufacturer Greer    Location Back    Number of allergen test 17    1. Peanut Negative    2. Soybean Negative    3. Wheat Negative    4. Sesame Negative    5. Milk, Cow Negative    6. Casein Negative    7. Egg White, Chicken Negative    8. Shellfish Mix Negative    9. Fish Mix Negative    10. Cashew Negative    11. Walnut Food Negative    12. Almond Negative    13. Hazelnut Negative    14. Pecan Food Negative    15. Pistachio Negative    16. [place] Nut Negative  17. Coconut Negative             Allergy testing results were read and interpreted by myself, documented by clinical staff.      Malachi Bonds, MD  [facility]

## 2022-10-05 NOTE — Patient Instructions (Addendum)
1. Allergic contact dermatitis - Continue with cetirizine 10 mg daily. - Continue to take pictures. - Allergy shots could help since these are likely related to dust mites and cat exposures.  - Continue with desonide as needed on the face and triamcinolone as needed on the rest of the body.  - Testing to the foods was completely negative, ruling out more than 95% of all food allergies.   2. Seasonal and perennial allergic rhinitis - Testing today showed: outdoor molds, dust mites, and cat - Copy of test results provided.  - Avoidance measures provided. - Continue with: Zyrtec (cetirizine) 10mg  tablet once daily - You can use an extra dose of the antihistamine, if needed, for breakthrough symptoms.  - Consider nasal saline rinses 1-2 times daily to remove allergens from the nasal cavities as well as help with mucous clearance (this is especially helpful to do before the nasal sprays are given) - We are going to take allergy shots as a means of long-term control. - Allergy shots "re-train" and "reset" the immune system to ignore environmental allergens and decrease the resulting immune response to those allergens (sneezing, itchy watery eyes, runny nose, nasal congestion, etc).    - Allergy shots improve symptoms in 75-85% of patients.   3. Return in about 6 months (around 04/07/2023). You can have the follow up appointment with Dr. Dellis Anes or a Nurse Practicioner (our Nurse Practitioners are excellent and always have Physician oversight!).    Please inform us of any Emergency Department visits, hospitalizations, or changes in symptoms. Call us before going to the ED for breathing or allergy symptoms since we might be able to fit you in for a sick visit. Feel free to contact us anytime with any questions, problems, or concerns.  It was a pleasure to meet you today!  Websites that have reliable patient information: 1. American Academy of Asthma, Allergy, and Immunology: www.aaaai.org 2. Food  Allergy Research and Education (FARE): foodallergy.org 3. Mothers of Asthmatics: http://www.asthmacommunitynetwork.org 4. American College of Allergy, Asthma, and Immunology: www.acaai.org   COVID-19 Vaccine Information can be found at: [email or web address] For questions related to vaccine distribution or appointments, please email [email or web address] or call [phone].   We realize that you might be concerned about having an allergic reaction to the COVID19 vaccines. To help with that concern, WE ARE OFFERING THE COVID19 VACCINES IN OUR OFFICE! Ask the front desk for dates!     "Like" [place] on Facebook and Instagram for our latest updates!      A healthy democracy works best when [employer] participate! Make sure you are registered to vote! If you have moved or changed any of your contact information, you will need to get this updated before voting!  In some cases, you MAY be able to register to vote online: [email or web address]        Airborne Adult Perc - 10/05/22 1355     Time Antigen Placed 1355    Allergen Manufacturer Waynette Buttery    Location Back    Number of Test 55    1. Control-Buffer 50% Glycerol Negative    2. Control-Histamine 2+    3. Bahia Negative    4. French Southern Territories Negative    5. Johnson Negative    6. Kentucky Blue Negative    7. Meadow Fescue Negative    8. Perennial Rye Negative    9. Timothy Negative    10. Ragweed Mix Negative    11. Cocklebur Negative    12. Plantain,  English Negative  13. Baccharis Negative    14. Dog Fennel Negative    15. Russian Thistle Negative    16. Lamb's Quarters Negative    17. Sheep Sorrell Negative    18. Rough Pigweed Negative    19. Marsh Elder, Rough Negative    20. Mugwort, Common Negative    21. Box, Elder Negative    22. Cedar, red Negative    23. Sweet Gum Negative    24. Pecan Pollen Negative    25. Pine Mix Negative    26.  Walnut, Black Pollen Negative    27. Red Mulberry Negative    28. Ash Mix Negative    29. Birch Mix Negative    30. Beech American Negative    31. Cottonwood, Guinea-Bissau Negative    32. Hickory, White Negative    33. Maple Mix Negative    34. Oak, Guinea-[place]issau Mix Negative    35. Sycamore Eastern Negative    36. Alternaria Alternata Negative    37. Cladosporium Herbarum Negative    38. Aspergillus Mix Negative    39. Penicillium Mix Negative    40. Bipolaris Sorokiniana (Helminthosporium) Negative    41. Drechslera Spicifera (Curvularia) Negative    42. Mucor Plumbeus Negative    43. Fusarium Moniliforme Negative    44. Aureobasidium Pullulans (pullulara) Negative    45. Rhizopus Oryzae Negative    46. Botrytis Cinera Negative    47. Epicoccum Nigrum Negative    48. Phoma Betae Negative    49. Dust Mite Mix 4+    50. Cat Hair 10,000 BAU/ml Negative    51.  Dog Epithelia Negative    52. Mixed Feathers Negative    54. Cockroach, German Negative    55. Tobacco Leaf Negative             Intradermal - 10/05/22 1417     Time Antigen Placed 0230    Allergen Manufacturer Waynette Buttery    Location Arm    Number of Test 15    Intradermal Select    Control Negative    Bahia Negative    French Southern Territories Negative    Johnson Negative    7 Grass Negative    Ragweed Mix Negative    Weed Mix Negative    Tree Mix Negative    Mold 1 Negative    Mold 2 Negative    Mold 3 3+    Mold 4 Negative    Cat Negative    Dog Negative    Cockroach Negative             Food Adult Perc - 10/05/22 1300     Time Antigen Placed 1356    Allergen Manufacturer Greer    Location Back    Number of allergen test 17    1. Peanut Negative    2. Soybean Negative    3. Wheat Negative    4. Sesame Negative    5. Milk, Cow Negative    6. Casein Negative    7. Egg White, Chicken Negative    8. Shellfish Mix Negative    9. Fish Mix Negative    10. Cashew Negative    11. Walnut Food Negative    12. Almond  Negative    13. Hazelnut Negative    14. Pecan Food Negative    15. Pistachio Negative    16. [place] Nut Negative    17. Coconut Negative            Control of  Dust Mite Allergen    Dust mites play a major role in allergic asthma and rhinitis.  They occur in environments with high humidity wherever human skin is found.  Dust mites absorb humidity from the atmosphere (ie, they do not drink) and feed on organic matter (including shed human and animal skin).  Dust mites are a microscopic type of insect that you cannot see with the naked eye.  High levels of dust mites have been detected from mattresses, pillows, carpets, upholstered furniture, bed covers, clothes, soft toys and any woven material.  The principal allergen of the dust mite is found in its feces.  A gram of dust may contain 1,000 mites and 250,000 fecal particles.  Mite antigen is easily measured in the air during house cleaning activities.  Dust mites do not bite and do not cause harm to humans, other than by triggering allergies/asthma.    Ways to decrease your exposure to dust mites in your home:  Encase mattresses, box springs and pillows with a mite-impermeable barrier or cover   Wash sheets, blankets and drapes weekly in hot water (130 F) with detergent and dry them in a dryer on the hot setting.  Have the room cleaned frequently with a vacuum cleaner and a damp dust-mop.  For carpeting or rugs, vacuuming with a vacuum cleaner equipped with a high-efficiency particulate air (HEPA) filter.  The dust mite allergic individual should not be in a room which is being cleaned and should wait 1 hour after cleaning before going into the room. Do not sleep on upholstered furniture (eg, couches).   If possible removing carpeting, upholstered furniture and drapery from the home is ideal.  Horizontal blinds should be eliminated in the rooms where the person spends the most time (bedroom, study, television room).  Washable vinyl,  roller-type shades are optimal. Remove all non-washable stuffed toys from the bedroom.  Wash stuffed toys weekly like sheets and blankets above.   Reduce indoor humidity to less than 50%.  Inexpensive humidity monitors can be purchased at most hardware stores.  Do not use a humidifier as can make the problem worse and are not recommended.  Control of Dog or Cat Allergen  Avoidance is the best way to manage a dog or cat allergy. If you have a dog or cat and are allergic to dog or cats, consider removing the dog or cat from the home. If you have a dog or cat but don't want to find it a new home, or if your family wants a pet even though someone in the household is allergic, here are some strategies that may help keep symptoms at bay:  Keep the pet out of your bedroom and restrict it to only a few rooms. Be advised that keeping the dog or cat in only one room will not limit the allergens to that room. Don't pet, hug or kiss the dog or cat; if you do, wash your hands with soap and water. High-efficiency particulate air (HEPA) cleaners run continuously in a bedroom or living room can reduce allergen levels over time. Regular use of a high-efficiency vacuum cleaner or a central vacuum can reduce allergen levels. Giving your dog or cat a bath at least once a week can reduce airborne allergen.  Control of Mold Allergen   Mold and fungi can grow on a variety of surfaces provided certain temperature and moisture conditions exist.  Outdoor molds grow on plants, decaying vegetation and soil.  The major outdoor mold, Alternaria and  Cladosporium, are found in very high numbers during hot and dry conditions.  Generally, a late Summer - Fall peak is seen for common outdoor fungal spores.  Rain will temporarily lower outdoor mold spore count, but counts rise rapidly when the rainy period ends.  The most important indoor molds are Aspergillus and Penicillium.  Dark, humid and poorly ventilated basements are ideal  sites for mold growth.  The next most common sites of mold growth are the bathroom and the kitchen.  Outdoor (Seasonal) Mold Control  Positive outdoor molds via skin testing: Bipolaris (Helminthsporium), Drechslera (Curvalaria), and Mucor  Use air conditioning and keep windows closed Avoid exposure to decaying vegetation. Avoid leaf raking. Avoid grain handling. Consider wearing a face mask if working in moldy areas.   Allergy Shots  Allergies are the result of a chain reaction that starts in the immune system. Your immune system controls how your body defends itself. For instance, if you have an allergy to pollen, your immune system identifies pollen as an invader or allergen. Your immune system overreacts by producing antibodies called Immunoglobulin E (IgE). These antibodies travel to cells that release chemicals, causing an allergic reaction.  The concept behind allergy immunotherapy, whether it is received in the form of shots or tablets, is that the immune system can be desensitized to specific allergens that trigger allergy symptoms. Although it requires time and patience, the payback can be long-term relief. Allergy injections contain a dilute solution of those substances that you are allergic to based upon your skin testing and allergy history.   How Do Allergy Shots Work?  Allergy shots work much like a vaccine. Your body responds to injected amounts of a particular allergen given in increasing doses, eventually developing a resistance and tolerance to it. Allergy shots can lead to decreased, minimal or no allergy symptoms.  There generally are two phases: build-up and maintenance. Build-up often ranges from three to six months and involves receiving injections with increasing amounts of the allergens. The shots are typically given once or twice a week, though more rapid build-up schedules are sometimes used.  The maintenance phase begins when the most effective dose is reached.  This dose is different for each person, depending on how allergic you are and your response to the build-up injections. Once the maintenance dose is reached, there are longer periods between injections, typically two to four weeks.  Occasionally doctors give cortisone-type shots that can temporarily reduce allergy symptoms. These types of shots are different and should not be confused with allergy immunotherapy shots.  Who Can Be Treated with Allergy Shots?  Allergy shots may be a good treatment approach for people with allergic rhinitis (hay fever), allergic asthma, conjunctivitis (eye allergy) or stinging insect allergy.   Before deciding to begin allergy shots, you should consider:   The length of allergy season and the severity of your symptoms  Whether medications and/or changes to your environment can control your symptoms  Your desire to avoid long-term medication use  Time: allergy immunotherapy requires a major time commitment  Cost: may vary depending on your insurance coverage  Allergy shots for children age 78 and older are effective and often well tolerated. They might prevent the onset of new allergen sensitivities or the progression to asthma.  Allergy shots are not started on patients who are pregnant but can be continued on patients who become pregnant while receiving them. In some patients with other medical conditions or who take certain common medications, allergy shots may  be of risk. It is important to mention other medications you talk to your allergist.   What are the two types of build-ups offered:   RUSH or Rapid Desensitization -- one day of injections lasting from 8:30-4:30pm, injections every 1 hour.  Approximately half of the build-up process is completed in that one day.  The following week, normal build-up is resumed, and this entails ~16 visits either weekly or twice weekly, until reaching your "maintenance dose" which is continued weekly until eventually  getting spaced out to every month for a duration of 3 to 5 years. The regular build-up appointments are nurse visits where the injections are administered, followed by required monitoring for 30 minutes.    Traditional build-up -- weekly visits for 6 -12 months until reaching "maintenance dose", then continue weekly until eventually spacing out to every 4 weeks as above. At these appointments, the injections are administered, followed by required monitoring for 30 minutes.     Either way is acceptable, and both are equally effective. With the rush protocol, the advantage is that less time is spent here for injections overall AND you would also reach maintenance dosing faster (which is when the clinical benefit starts to become more apparent). Not everyone is a candidate for rapid desensitization.   IF we proceed with the RUSH protocol, there are premedications which must be taken the day before and the day after the rush only (this includes antihistamines, steroids, and Singulair).  After the rush day, no prednisone or Singulair is required, and we just recommend antihistamines taken on your injection day.  What Is An Estimate of the Costs?  If you are interested in starting allergy injections, please check with your insurance company about your coverage for both allergy vial sets and allergy injections.  Please do so prior to making the appointment to start injections.  The following are CPT codes to give to your insurance company. These are the amounts we BILL to the insurance company, but the amount YOU WILL PAY and WE RECEIVE IS SUBSTANTIALLY LESS and depends on the contracts we have with different insurance companies.   Amount Billed to Insurance One allergy vial set  CPT 95165   $ 1200     Two allergy vial set  CPT 95165   $ 2400     Three allergy vial set  CPT 95165   $ 3600     One injection   CPT 95115   $ 35  Two injections   CPT 95117   $ 40 RUSH (Rapid Desensitization) CPT 95180 x 8  hours $500/hour  Regarding the allergy injections, your co-pay may or may not apply with each injection, so please confirm this with your insurance company. When you start allergy injections, 1 or 2 sets of vials are made based on your allergies.  Not all patients can be on one set of vials. A set of vials lasts 6 months to a year depending on how quickly you can proceed with your build-up of your allergy injections. Vials are personalized for each patient depending on their specific allergens.  How often are allergy injection given during the build-up period?   Injections are given at least weekly during the build-up period until your maintenance dose is achieved. Per the doctor's discretion, you may have the option of getting allergy injections two times per week during the build-up period. However, there must be at least 48 hours between injections. The build-up period is usually completed within 6-12 months depending  on your ability to schedule injections and for adjustments for reactions. When maintenance dose is reached, your injection schedule is gradually changed to every two weeks and later to every three weeks. Injections will then continue every 4 weeks. Usually, injections are continued for a total of 3-5 years.   When Will I Feel Better?  Some may experience decreased allergy symptoms during the build-up phase. For others, it may take as long as 12 months on the maintenance dose. If there is no improvement after a year of maintenance, your allergist will discuss other treatment options with you.  If you aren't responding to allergy shots, it may be because there is not enough dose of the allergen in your vaccine or there are missing allergens that were not identified during your allergy testing. Other reasons could be that there are high levels of the allergen in your environment or major exposure to non-allergic triggers like tobacco smoke.  What Is the Length of Treatment?  Once the  maintenance dose is reached, allergy shots are generally continued for three to five years. The decision to stop should be discussed with your allergist at that time. Some people may experience a permanent reduction of allergy symptoms. Others may relapse and a longer course of allergy shots can be considered.  What Are the Possible Reactions?  The two types of adverse reactions that can occur with allergy shots are local and systemic. Common local reactions include very mild redness and swelling at the injection site, which can happen immediately or several hours after. Report a delayed reaction from your last injection. These include arm swelling or runny nose, watery eyes or cough that occurs within 12-24 hours after injection. A systemic reaction, which is less common, affects the entire body or a particular body system. They are usually mild and typically respond quickly to medications. Signs include increased allergy symptoms such as sneezing, a stuffy nose or hives.   Rarely, a serious systemic reaction called anaphylaxis can develop. Symptoms include swelling in the throat, wheezing, a feeling of tightness in the chest, nausea or dizziness. Most serious systemic reactions develop within 30 minutes of allergy shots. This is why it is strongly recommended you wait in your doctor's office for 30 minutes after your injections. Your allergist is trained to watch for reactions, and his or her staff is trained and equipped with the proper medications to identify and treat them.   Report to the nurse immediately if you experience any of the following symptoms: swelling, itching or redness of the skin, hives, watery eyes/nose, breathing difficulty, excessive sneezing, coughing, stomach pain, diarrhea, or light headedness. These symptoms may occur within 15-20 minutes after injection and may require medication.   Who Should Administer Allergy Shots?  The preferred location for receiving shots is your  prescribing allergist's office. Injections can sometimes be given at another facility where the physician and staff are trained to recognize and treat reactions, and have received instructions by your prescribing allergist.  What if I am late for an injection?   Injection dose will be adjusted depending upon how many days or weeks you are late for your injection.   What if I am sick?   Please report any illness to the nurse before receiving injections. She may adjust your dose or postpone injections depending on your symptoms. If you have fever, flu, sinus infection or chest congestion it is best to postpone allergy injections until you are better. Never get an allergy injection if your asthma  is causing you problems. If your symptoms persist, seek out medical care to get your health problem under control.  What If I am or Become Pregnant:  Women that become pregnant should schedule an appointment with [facility] before receiving any further allergy injections.

## 2022-11-03 ENCOUNTER — Inpatient Hospital Stay (HOSPITAL_COMMUNITY)
Admission: EM | Admit: 2022-11-03 | Discharge: 2022-11-05 | DRG: 322 | Disposition: A | Discharge status: Home / Self Care | Payer: Medicaid Other | Attending: Internal Medicine | Admitting: Internal Medicine

## 2022-11-03 ENCOUNTER — Encounter (HOSPITAL_COMMUNITY): Payer: Self-pay | Admitting: *Deleted

## 2022-11-03 ENCOUNTER — Other Ambulatory Visit: Payer: Self-pay

## 2022-11-03 ENCOUNTER — Emergency Department (HOSPITAL_COMMUNITY): Payer: Medicaid Other

## 2022-11-03 DIAGNOSIS — I2510 Atherosclerotic heart disease of native coronary artery without angina pectoris: Secondary | ICD-10-CM | POA: Diagnosis present

## 2022-11-03 DIAGNOSIS — Z8249 Family history of ischemic heart disease and other diseases of the circulatory system: Secondary | ICD-10-CM

## 2022-11-03 DIAGNOSIS — R7989 Other specified abnormal findings of blood chemistry: Secondary | ICD-10-CM | POA: Insufficient documentation

## 2022-11-03 DIAGNOSIS — Z888 Allergy status to other drugs, medicaments and biological substances status: Secondary | ICD-10-CM

## 2022-11-03 DIAGNOSIS — I200 Unstable angina: Principal | ICD-10-CM

## 2022-11-03 DIAGNOSIS — E876 Hypokalemia: Secondary | ICD-10-CM

## 2022-11-03 DIAGNOSIS — R079 Chest pain, unspecified: Secondary | ICD-10-CM | POA: Diagnosis not present

## 2022-11-03 DIAGNOSIS — R0789 Other chest pain: Secondary | ICD-10-CM | POA: Diagnosis not present

## 2022-11-03 DIAGNOSIS — F17210 Nicotine dependence, cigarettes, uncomplicated: Secondary | ICD-10-CM | POA: Diagnosis present

## 2022-11-03 DIAGNOSIS — E785 Hyperlipidemia, unspecified: Secondary | ICD-10-CM

## 2022-11-03 DIAGNOSIS — Z955 Presence of coronary angioplasty implant and graft: Secondary | ICD-10-CM

## 2022-11-03 DIAGNOSIS — Z79899 Other long term (current) drug therapy: Secondary | ICD-10-CM

## 2022-11-03 DIAGNOSIS — I10 Essential (primary) hypertension: Secondary | ICD-10-CM

## 2022-11-03 DIAGNOSIS — D72829 Elevated white blood cell count, unspecified: Secondary | ICD-10-CM

## 2022-11-03 DIAGNOSIS — Z833 Family history of diabetes mellitus: Secondary | ICD-10-CM

## 2022-11-03 DIAGNOSIS — Z7984 Long term (current) use of oral hypoglycemic drugs: Secondary | ICD-10-CM

## 2022-11-03 DIAGNOSIS — E119 Type 2 diabetes mellitus without complications: Secondary | ICD-10-CM

## 2022-11-03 DIAGNOSIS — Z23 Encounter for immunization: Secondary | ICD-10-CM

## 2022-11-03 DIAGNOSIS — E782 Mixed hyperlipidemia: Secondary | ICD-10-CM | POA: Diagnosis present

## 2022-11-03 DIAGNOSIS — I214 Non-ST elevation (NSTEMI) myocardial infarction: Principal | ICD-10-CM | POA: Diagnosis present

## 2022-11-03 DIAGNOSIS — Z720 Tobacco use: Secondary | ICD-10-CM

## 2022-11-03 HISTORY — DX: Essential (primary) hypertension: I10

## 2022-11-03 LAB — TROPONIN I (HIGH SENSITIVITY)
Troponin I (High Sensitivity): 183 ng/L (ref <18)
Troponin I (High Sensitivity): 212 ng/L (ref <18)
Troponin I (High Sensitivity): 239 ng/L (ref <18)
Troponin I (High Sensitivity): 244 ng/L (ref <18)

## 2022-11-03 LAB — MRSA NEXT GEN BY PCR, NASAL: MRSA by PCR Next Gen: NOT DETECTED

## 2022-11-03 LAB — D-DIMER, QUANTITATIVE: D-Dimer, Quant: 0.43 ug/mL-FEU (ref 0.00–0.50)

## 2022-11-03 LAB — HEPATIC FUNCTION PANEL
ALT: 14 U/L (ref 0–44)
AST: 18 U/L (ref 15–41)
Albumin: 4.1 g/dL (ref 3.5–5.0)
Alkaline Phosphatase: 56 U/L (ref 38–126)
Bilirubin, Direct: <0.1 mg/dL (ref 0.0–0.2)
Total Bilirubin: 0.6 mg/dL (ref 0.3–1.2)
Total Protein: 7.3 g/dL (ref 6.5–8.1)

## 2022-11-03 LAB — BASIC METABOLIC PANEL
Anion gap: 10 (ref 5–15)
BUN: 15 mg/dL (ref 6–20)
CO2: 24 mmol/L (ref 22–32)
Calcium: 9.2 mg/dL (ref 8.9–10.3)
Chloride: 103 mmol/L (ref 98–111)
Creatinine, Ser: 0.49 mg/dL (ref 0.44–1.00)
GFR, Estimated: >60 mL/min (ref >60)
Glucose, Bld: 121 mg/dL — ABNORMAL HIGH (ref 70–99)
Potassium: 3.2 mmol/L — ABNORMAL LOW (ref 3.5–5.1)
Sodium: 137 mmol/L (ref 135–145)

## 2022-11-03 LAB — CBC
HCT: 38.9 % (ref 36.0–46.0)
Hemoglobin: 13.2 g/dL (ref 12.0–15.0)
MCH: 30 pg (ref 26.0–34.0)
MCHC: 33.9 g/dL (ref 30.0–36.0)
MCV: 88.4 fL (ref 80.0–100.0)
Platelets: 303 10*3/uL (ref 150–400)
RBC: 4.4 MIL/uL (ref 3.87–5.11)
RDW: 13.5 % (ref 11.5–15.5)
WBC: 11.7 10*3/uL — ABNORMAL HIGH (ref 4.0–10.5)
nRBC: 0 % (ref 0.0–0.2)

## 2022-11-03 LAB — HEPARIN LEVEL (UNFRACTIONATED): Heparin Unfractionated: 0.11 IU/mL — ABNORMAL LOW (ref 0.30–0.70)

## 2022-11-03 LAB — TSH: TSH: 0.978 u[IU]/mL (ref 0.350–4.500)

## 2022-11-03 LAB — CBG MONITORING, ED: Glucose-Capillary: 102 mg/dL — ABNORMAL HIGH (ref 70–99)

## 2022-11-03 LAB — HIV ANTIBODY (ROUTINE TESTING W REFLEX): HIV Screen 4th Generation wRfx: NONREACTIVE

## 2022-11-03 LAB — GLUCOSE, CAPILLARY: Glucose-Capillary: 121 mg/dL — ABNORMAL HIGH (ref 70–99)

## 2022-11-03 LAB — MAGNESIUM: Magnesium: 1.7 mg/dL (ref 1.7–2.4)

## 2022-11-03 MED ORDER — LORATADINE 10 MG PO TABS
10.0000 mg | ORAL_TABLET | Freq: Two times a day (BID) | ORAL | Status: DC
  Administered 2022-11-03 – 2022-11-05 (×4): 10 mg via ORAL
  Filled 2022-11-03 (×4): qty 1

## 2022-11-03 MED ORDER — INSULIN ASPART 100 UNIT/ML IJ SOLN: 0.0000 [IU] | Freq: Three times a day (TID) | INTRAMUSCULAR | Status: DC

## 2022-11-03 MED ORDER — ORAL CARE MOUTH RINSE: 15.0000 mL | OROMUCOSAL | Status: DC | PRN

## 2022-11-03 MED ORDER — HYDROXYZINE HCL 25 MG PO TABS
25.0000 mg | ORAL_TABLET | Freq: Every day | ORAL | Status: DC
  Administered 2022-11-03 – 2022-11-04 (×2): 25 mg via ORAL
  Filled 2022-11-03 (×2): qty 1

## 2022-11-03 MED ORDER — ASPIRIN 325 MG PO TABS
325.0000 mg | ORAL_TABLET | Freq: Once | ORAL | Status: AC
  Administered 2022-11-03: 325 mg via ORAL
  Filled 2022-11-03: qty 1

## 2022-11-03 MED ORDER — KETOROLAC TROMETHAMINE 30 MG/ML IJ SOLN
30.0000 mg | Freq: Once | INTRAMUSCULAR | Status: AC
  Administered 2022-11-03: 30 mg via INTRAVENOUS
  Filled 2022-11-03: qty 1

## 2022-11-03 MED ORDER — POTASSIUM CHLORIDE CRYS ER 20 MEQ PO TBCR
40.0000 meq | EXTENDED_RELEASE_TABLET | Freq: Four times a day (QID) | ORAL | Status: AC
  Administered 2022-11-03 (×2): 40 meq via ORAL
  Filled 2022-11-03 (×2): qty 2

## 2022-11-03 MED ORDER — HEPARIN BOLUS VIA INFUSION
4000.0000 [IU] | Freq: Once | INTRAVENOUS | Status: AC
  Administered 2022-11-03: 4000 [IU] via INTRAVENOUS

## 2022-11-03 MED ORDER — ASPIRIN 81 MG PO TBEC
81.0000 mg | DELAYED_RELEASE_TABLET | Freq: Every day | ORAL | Status: DC
  Administered 2022-11-04 – 2022-11-05 (×2): 81 mg via ORAL
  Filled 2022-11-03 (×2): qty 1

## 2022-11-03 MED ORDER — PNEUMOCOCCAL 20-VAL CONJ VACC 0.5 ML IM SUSY
0.5000 mL | PREFILLED_SYRINGE | INTRAMUSCULAR | Status: AC
  Administered 2022-11-04: 0.5 mL via INTRAMUSCULAR
  Filled 2022-11-03: qty 0.5

## 2022-11-03 MED ORDER — ACETAMINOPHEN 325 MG PO TABS: 650.0000 mg | ORAL_TABLET | Freq: Four times a day (QID) | ORAL | Status: DC | PRN

## 2022-11-03 MED ORDER — SIMVASTATIN 20 MG PO TABS
20.0000 mg | ORAL_TABLET | Freq: Every day | ORAL | Status: DC
  Administered 2022-11-03: 20 mg via ORAL
  Filled 2022-11-03: qty 1

## 2022-11-03 MED ORDER — ACETAMINOPHEN 650 MG RE SUPP: 650.0000 mg | Freq: Four times a day (QID) | RECTAL | Status: DC | PRN

## 2022-11-03 MED ORDER — NICOTINE 14 MG/24HR TD PT24
14.0000 mg | MEDICATED_PATCH | Freq: Once | TRANSDERMAL | Status: AC
  Administered 2022-11-03: 14 mg via TRANSDERMAL
  Filled 2022-11-03: qty 1

## 2022-11-03 MED ORDER — NICOTINE POLACRILEX 2 MG MT GUM: 2.0000 mg | CHEWING_GUM | OROMUCOSAL | Status: DC | PRN

## 2022-11-03 MED ORDER — MORPHINE SULFATE (PF) 4 MG/ML IV SOLN
4.0000 mg | Freq: Once | INTRAVENOUS | Status: AC
  Administered 2022-11-03: 4 mg via INTRAVENOUS
  Filled 2022-11-03: qty 1

## 2022-11-03 MED ORDER — SODIUM CHLORIDE 0.9 % IV BOLUS
500.0000 mL | Freq: Once | INTRAVENOUS | Status: AC
  Administered 2022-11-03: 500 mL via INTRAVENOUS

## 2022-11-03 MED ORDER — ONDANSETRON HCL 4 MG/2ML IJ SOLN: 4.0000 mg | Freq: Four times a day (QID) | INTRAMUSCULAR | Status: DC | PRN

## 2022-11-03 MED ORDER — METOPROLOL SUCCINATE ER 25 MG PO TB24
12.5000 mg | ORAL_TABLET | Freq: Every day | ORAL | Status: DC
  Administered 2022-11-03 – 2022-11-05 (×3): 12.5 mg via ORAL
  Filled 2022-11-03 (×3): qty 1

## 2022-11-03 MED ORDER — CHLORHEXIDINE GLUCONATE CLOTH 2 % EX PADS
6.0000 | MEDICATED_PAD | Freq: Every day | CUTANEOUS | Status: DC
  Administered 2022-11-03 – 2022-11-05 (×2): 6 via TOPICAL

## 2022-11-03 MED ORDER — TRAZODONE HCL 50 MG PO TABS
200.0000 mg | ORAL_TABLET | Freq: Every evening | ORAL | Status: DC | PRN
  Administered 2022-11-03 – 2022-11-04 (×2): 200 mg via ORAL
  Filled 2022-11-03 (×2): qty 4

## 2022-11-03 MED ORDER — HYDROCODONE-ACETAMINOPHEN 5-325 MG PO TABS
1.0000 | ORAL_TABLET | ORAL | Status: DC | PRN
  Administered 2022-11-04: 1 via ORAL
  Administered 2022-11-05: 2 via ORAL
  Administered 2022-11-05: 1 via ORAL
  Administered 2022-11-05: 2 via ORAL
  Filled 2022-11-03 (×2): qty 2
  Filled 2022-11-03 (×2): qty 1

## 2022-11-03 MED ORDER — HEPARIN (PORCINE) 25000 UT/250ML-% IV SOLN
1250.0000 [IU]/h | INTRAVENOUS | Status: DC
  Administered 2022-11-03: 900 [IU]/h via INTRAVENOUS
  Administered 2022-11-04: 1250 [IU]/h via INTRAVENOUS
  Filled 2022-11-03 (×2): qty 250

## 2022-11-03 MED ORDER — NITROGLYCERIN 0.4 MG SL SUBL: 0.4000 mg | SUBLINGUAL_TABLET | SUBLINGUAL | Status: DC | PRN

## 2022-11-03 NOTE — Assessment & Plan Note (Signed)
Appears to be on hydrochlorothiazide Hold this, start her on metoprolol

## 2022-11-03 NOTE — Assessment & Plan Note (Addendum)
Minimal elevation. No clinical suspicion for infectious etiology Likely inflammatory/reactive  Follow fever curve/trend

## 2022-11-03 NOTE — Progress Notes (Signed)
ANTICOAGULATION CONSULT NOTE - Initial Consult  Pharmacy Consult for Heparin Indication: chest pain/ACS  Allergies  Allergen Reactions   Prednisone Rash   Lisinopril Swelling    Patient Measurements: Height: 5\' 7"  (170.2 cm) Weight: 71.7 kg (158 lb) IBW/kg (Calculated) : 61.6 HEPARIN DW (KG): 71.7   Vital Signs: Temp: 99.3 F (37.4 C) (07/30 1334) Temp Source: Oral (07/30 1334) BP: 164/76 (07/30 1334) Pulse Rate: 93 (07/30 1334)  Labs: Recent Labs    11/03/22 1356  HGB 13.2  HCT 38.9  PLT 303  CREATININE 0.49  TROPONINIHS 183*    Estimated Creatinine Clearance: 72.7 mL/min (by C-G formula based on SCr of 0.49 mg/dL).   Medical History: Past Medical History:  Diagnosis Date   Diabetes mellitus without complication (HCC)    Hypertension     Medications:  See med rec  Assessment: 62 y.o. female with a hx of HTN, tobacco abuse, HLD, DM2 who presented to ED with chest pain. Patient not on oral anticoagulation. Pharmacy consulted to start Heparin  Goal of Therapy:  Heparin level 0.3-0.7 units/ml Monitor platelets by anticoagulation protocol: Yes   Plan:  Give 4000 units bolus x 1 Start heparin infusion at 900 units/hr Check anti-Xa level in ~6 hours and daily while on heparin Continue to monitor H&H and platelets  Elder Cyphers, BS Pharm D, BCPS Clinical Pharmacist 11/03/2022,3:46 PM

## 2022-11-03 NOTE — H&P (Signed)
History and Physical    Patient: [name] YNW:[id number] DOB: 1960/12/08 DOA: 11/03/2022 DOS: the patient was seen and examined on 11/03/2022 PCP: Tylene Fantasia., PA-C  Patient coming from: Home - lives with her mother, daughter and grandson. Ambulates independently.    Chief Complaint: chest pain   HPI: [name] is a 62 y.o. female with medical history significant of HTN, T2DM, HLD, and tobacco abuse who presented to ED with complaints of right sided chest pain that radiates down right arm x 1 week. She states pain got worse and was unbearable and it prompted her to come to ED. Pain on lateral side of right anterior chest wall. She has no pain with arm movement and pain not reproducible.  States pain was constant and dull/achy. Nothing makes it better or worse except pain medication in the hospital. She has no associated symptoms including shortness of breath, palpitations, diaphoresis or substernal chest pain. She has been feeling well.    Family history of CAD in her father. Multiple risk factors.    Denies any fever/chills, vision changes/headaches, chest pain or palpitations, shortness of breath or cough, abdominal pain, N/V/D, dysuria or leg swelling.    She does smoke 1/2 PPD. No alcohol.   ER Course:  vitals: afebrile, bp: 164/76, HR: 93, RR: 20, oxygen: 97%RA Pertinent labs: wbc: 11.7, potassium: 3.2, troponin 183>212,  CXR: no active disease In ED: cardiology consulted. Atypical CP, but has risks. Recommended heparin gtt, trend troponin, echo. NPO at midnight. TRH asked to admit.     Review of Systems: As mentioned in the history of present illness. All other systems reviewed and are negative. Past Medical History:  Diagnosis Date   Diabetes mellitus without complication (HCC)    Hypertension    Past Surgical History:  Procedure Laterality Date   ANKLE FRACTURE SURGERY Right    Social History:  reports that she has been smoking cigarettes. She has never been  exposed to tobacco smoke. She has never used smokeless tobacco. She reports that she does not currently use alcohol. She reports that she does not currently use drugs.  Allergies  Allergen Reactions   Prednisone Rash   Lisinopril Swelling    Family History  Problem Relation Age of Onset   Diabetes Father     Prior to Admission medications   Medication Sig Start Date End Date Taking? Authorizing Provider  acetaminophen-codeine (TYLENOL #3) 300-30 MG tablet Take 1 tablet by mouth every 6 (six) hours as needed for moderate pain. Patient not taking: Reported on 10/05/2022 02/16/22   Vickki Hearing, MD  Calcium Carbonate Antacid (ANTACID PO) Take 1 tablet by mouth daily.    [provider]  cetirizine (ZYRTEC) 10 MG tablet Take 10 mg by mouth daily. Patient not taking: Reported on 10/05/2022 07/23/22   [provider]  desonide (DESOWEN) 0.05 % cream Use twice daily for flare ups on face, maximum 10 days. Patient not taking: Reported on 10/05/2022 08/18/22   Birder Robson, MD  diphenhydrAMINE (BENADRYL ALLERGY) 25 MG tablet Take 50 mg by mouth 2 (two) times daily. Patient not taking: Reported on 10/05/2022    [provider]  Doxylamine Succinate, Sleep, (SLEEP AID PO) Take 2 tablets by mouth daily.    [provider]  EPINEPHrine 0.3 mg/0.3 mL IJ SOAJ injection Inject into the muscle. 08/14/22   [provider]  fluconazole (DIFLUCAN) 150 MG tablet Take 1 tablet (150 mg total) by mouth once a week.  Patient not taking: Reported on 10/05/2022 08/18/22   Birder Robson, MD  hydrochlorothiazide (HYDRODIURIL) 12.5 MG tablet Take 12.5 mg by mouth daily. 02/08/22   [provider]  hydrOXYzine (ATARAX) 25 MG tablet Take 25 mg by mouth 4 (four) times daily as needed. 11/20/21   [provider]  KRILL OIL PO Take 2 tablets by mouth 2 (two) times daily.    [provider]  loratadine (CLARITIN) 10 MG tablet Take 10 mg by mouth  daily. Patient not taking: Reported on 08/18/2022    [provider]  metFORMIN (GLUCOPHAGE) 1000 MG tablet Take 1,000 mg by mouth 2 (two) times daily. 06/20/19   [provider]  Multiple Vitamin (MULTIVITAMIN ADULT PO) Take 1 tablet by mouth.    [provider]  simvastatin (ZOCOR) 20 MG tablet Take 20 mg by mouth at bedtime. 08/04/19   [provider]  traMADol (ULTRAM) 50 MG tablet Take 50 mg by mouth every 6 (six) hours as needed. Patient not taking: Reported on 10/05/2022 09/09/22   [provider]  traZODone (DESYREL) 100 MG tablet Take 50-100 mg by mouth at bedtime as needed. 10/01/19   [provider]  triamcinolone ointment (KENALOG) 0.1 % Apply twice daily for flare ups below neck, maximum 10 days. Patient not taking: Reported on 10/05/2022 08/18/22   Birder Robson, MD    Physical Exam: Vitals:   11/03/22 1333 11/03/22 1334 11/03/22 1917  BP:  (!) 164/76 136/62  Pulse:  93 73  Resp:  20 12  Temp:  99.3 F (37.4 C) 98.2 F (36.8 C)  TempSrc:  Oral Oral  SpO2:  97% 94%  Weight: 71.7 kg    Height: 5\' 7"  (1.702 m)     General:  Appears calm and comfortable and is in NAD Eyes:  PERRL, EOMI, normal lids, iris ENT:  grossly normal hearing, lips & tongue, mmm; poor dentition Neck:  no LAD, masses or thyromegaly; no carotid bruits Cardiovascular:  RRR, no m/r/g. No LE edema.  Respiratory:   CTA bilaterally with no wheezes/rales/rhonchi.  Normal respiratory effort. Abdomen:  soft, NT, ND, NABS Back:   normal alignment, no CVAT Skin:  no rash or induration seen on limited exam Musculoskeletal:  grossly normal tone BUE/BLE, good ROM, no bony abnormality Lower extremity:  No LE edema.  Limited foot exam with no ulcerations.  2+ distal pulses. Psychiatric:  grossly normal mood and affect, speech fluent and appropriate, AOx3 Neurologic:  CN 2-12 grossly intact, moves all extremities in coordinated fashion, sensation  intact   Radiological Exams on Admission: Independently reviewed - see discussion in A/P where applicable  DG Chest Portable 1 View  Result Date: 11/03/2022 CLINICAL DATA:  Right-sided chest pain EXAM: PORTABLE CHEST 1 VIEW COMPARISON:  03/10/2010 FINDINGS: Heart size is normal. Mediastinal shadows are normal. There are old healed rib fractures on the right. The right lung is clear. There is mild linear scarring on the left adjacent to the heart border. No suspicion of active pneumonia, collapse, edema or effusion. IMPRESSION: No active disease. Old healed rib fractures on the right. Mild linear scarring on the left. Electronically Signed   By: Paulina Fusi M.D.   On: 11/03/2022 14:00    EKG: Independently reviewed.  NSR with rate 93; nonspecific ST changes with no evidence of acute ischemia. Non specific st changes    Labs on Admission: I have personally reviewed the available labs and imaging studies at the time of the admission.  Pertinent labs:   wbc: 11.7,  potassium: 3.2,  troponin 183>212  Assessment and Plan: Principal Problem:   Chest pain with elevated troponin Active Problems:   Leukocytosis   Hypokalemia   Controlled type 2 diabetes mellitus without complication, without long-term current use of insulin (HCC)   Essential hypertension   Hyperlipidemia   Tobacco abuse   Elevated troponin    Assessment and Plan: * Chest pain with elevated troponin 62 year old female presenting with one week history of right sided chest pain radiating down right arm  -obs to stepdown on tele  -appears more atypical in nature with reproducible pain making MSK more likely;however, she has multiple risk factors for CAD with trending upward troponin  -check echo -troponin 183>212, continue to trend -continue ASA/statin -start beta blocker -cardiology consulted, recommended continued heparin gtt, medical therapy and NPO at midnight in case any needed procedure tomorrow  -check  inflammatory markers -A1C/lipid panel/Lpa pending   Leukocytosis Minimal elevation. No clinical suspicion for infectious etiology Likely inflammatory/reactive  Follow fever curve/trend   Hypokalemia Check magnesium Repleted in ED Trend   Controlled type 2 diabetes mellitus without complication, without long-term current use of insulin (HCC) No recent A1C and none in system A1C pending Accuchecks qac/hs and moderate SSI  Hold metformin   Essential hypertension Appears to be on hydrochlorothiazide Hold this, start her on metoprolol   Hyperlipidemia Lipid panel pending Continue zocor for now 20mg  daily   Tobacco abuse Nicotine patch Encouraged  cessation      Advance Care Planning:   Code Status: Full Code   Consults: cardiology: Dr. Wyline Mood   DVT Prophylaxis: heparin gtt   Family Communication: none   Severity of Illness: The appropriate patient status for this patient is OBSERVATION. Observation status is judged to be reasonable and necessary in order to provide the required intensity of service to ensure the patient's safety. The patient's presenting symptoms, physical exam findings, and initial radiographic and laboratory data in the context of their medical condition is felt to place them at decreased risk for further clinical deterioration. Furthermore, it is anticipated that the patient will be medically stable for discharge from the hospital within 2 midnights of admission.   Author: Orland Mustard, MD 11/03/2022 7:52 PM  For on call review [email or web address].

## 2022-11-03 NOTE — Plan of Care (Signed)
  Problem: Education: Goal: Ability to describe self-care measures that may prevent or decrease complications (Diabetes Survival Skills Education) will improve Outcome: Progressing Goal: Individualized Educational Video(s) Outcome: Progressing   Problem: Coping: Goal: Ability to adjust to condition or change in health will improve Outcome: Progressing   Problem: Fluid Volume: Goal: Ability to maintain a balanced intake and output will improve Outcome: Progressing   Problem: Health Behavior/Discharge Planning: Goal: Ability to identify and utilize available resources and services will improve Outcome: Progressing Goal: Ability to manage health-related needs will improve Outcome: Progressing   Problem: Metabolic: Goal: Ability to maintain appropriate glucose levels will improve Outcome: Progressing   Problem: Nutritional: Goal: Maintenance of adequate nutrition will improve Outcome: Progressing Goal: Progress toward achieving an optimal weight will improve Outcome: Progressing   Problem: Skin Integrity: Goal: Risk for impaired skin integrity will decrease Outcome: Progressing   Problem: Tissue Perfusion: Goal: Adequacy of tissue perfusion will improve Outcome: Progressing   Problem: Education: Goal: Knowledge of General Education information will improve Description: Including pain rating scale, medication(s)/side effects and non-pharmacologic comfort measures Outcome: Progressing   Problem: Health Behavior/Discharge Planning: Goal: Ability to manage health-related needs will improve Outcome: Progressing   Problem: Clinical Measurements: Goal: Ability to maintain clinical measurements within normal limits will improve Outcome: Progressing Goal: Will remain free from infection Outcome: Progressing Goal: Diagnostic test results will improve Outcome: Progressing Goal: Respiratory complications will improve Outcome: Progressing Goal: Cardiovascular complication will  be avoided Outcome: Progressing   Problem: Activity: Goal: Risk for activity intolerance will decrease Outcome: Progressing   Problem: Nutrition: Goal: Adequate nutrition will be maintained Outcome: Progressing   Problem: Coping: Goal: Level of anxiety will decrease Outcome: Progressing   Problem: Elimination: Goal: Will not experience complications related to bowel motility Outcome: Progressing Goal: Will not experience complications related to urinary retention Outcome: Progressing   Problem: Pain Managment: Goal: General experience of comfort will improve Outcome: Progressing   Problem: Safety: Goal: Ability to remain free from injury will improve Outcome: Progressing   Problem: Skin Integrity: Goal: Risk for impaired skin integrity will decrease Outcome: Progressing   Problem: Education: Goal: Understanding of cardiac disease, CV risk reduction, and recovery process will improve Outcome: Progressing Goal: Individualized Educational Video(s) Outcome: Progressing   Problem: Activity: Goal: Ability to tolerate increased activity will improve Outcome: Progressing   Problem: Cardiac: Goal: Ability to achieve and maintain adequate cardiovascular perfusion will improve Outcome: Progressing   Problem: Health Behavior/Discharge Planning: Goal: Ability to safely manage health-related needs after discharge will improve Outcome: Progressing

## 2022-11-03 NOTE — Assessment & Plan Note (Signed)
Nicotine patch Encouraged cessation  

## 2022-11-03 NOTE — ED Provider Notes (Signed)
[place] EMERGENCY DEPARTMENT AT [facility] Provider Note   CSN: [id number] Arrival date & time: 11/03/22  1320     History  Chief Complaint  Patient presents with   Chest Pain    [name] is a 62 y.o. female.  HPI Patient presents with chest pain.  Patient initially had right shoulder pain anteriorly, which began about a week ago without clear precipitant.  Pain episodes have been brief, but over the past day or so become more frequent, and severe now with radiation across the anterior chest described as tightness or sharpness.  No lightheadedness, no syncope.  No history of cardiac disease. She does have a history of right shoulder injury in the distant past, but notes that she has not had substantial problems with it.    Home Medications Prior to Admission medications   Medication Sig Start Date End Date Taking? Authorizing Provider  acetaminophen-codeine (TYLENOL #3) 300-30 MG tablet Take 1 tablet by mouth every 6 (six) hours as needed for moderate pain. Patient not taking: Reported on 10/05/2022 02/16/22   Vickki Hearing, MD  Calcium Carbonate Antacid (ANTACID PO) Take 1 tablet by mouth daily.    [provider]  cetirizine (ZYRTEC) 10 MG tablet Take 10 mg by mouth daily. Patient not taking: Reported on 10/05/2022 07/23/22   [provider]  desonide (DESOWEN) 0.05 % cream Use twice daily for flare ups on face, maximum 10 days. Patient not taking: Reported on 10/05/2022 08/18/22   Birder Robson, MD  diphenhydrAMINE (BENADRYL ALLERGY) 25 MG tablet Take 50 mg by mouth 2 (two) times daily. Patient not taking: Reported on 10/05/2022    [provider]  Doxylamine Succinate, Sleep, (SLEEP AID PO) Take 2 tablets by mouth daily.    [provider]  EPINEPHrine 0.3 mg/0.3 mL IJ SOAJ injection Inject into the muscle. 08/14/22   [provider]  fluconazole (DIFLUCAN) 150 MG tablet Take 1 tablet (150 mg total) by mouth once a  week. Patient not taking: Reported on 10/05/2022 08/18/22   Birder Robson, MD  hydrochlorothiazide (HYDRODIURIL) 12.5 MG tablet Take 12.5 mg by mouth daily. 02/08/22   [provider]  hydrOXYzine (ATARAX) 25 MG tablet Take 25 mg by mouth 4 (four) times daily as needed. 11/20/21   [provider]  KRILL OIL PO Take 2 tablets by mouth 2 (two) times daily.    [provider]  loratadine (CLARITIN) 10 MG tablet Take 10 mg by mouth daily. Patient not taking: Reported on 08/18/2022    [provider]  metFORMIN (GLUCOPHAGE) 1000 MG tablet Take 1,000 mg by mouth 2 (two) times daily. 06/20/19   [provider]  Multiple Vitamin (MULTIVITAMIN ADULT PO) Take 1 tablet by mouth.    [provider]  simvastatin (ZOCOR) 20 MG tablet Take 20 mg by mouth at bedtime. 08/04/19   [provider]  traMADol (ULTRAM) 50 MG tablet Take 50 mg by mouth every 6 (six) hours as needed. Patient not taking: Reported on 10/05/2022 09/09/22   [provider]  traZODone (DESYREL) 100 MG tablet Take 50-100 mg by mouth at bedtime as needed. 10/01/19   [provider]  triamcinolone ointment (KENALOG) 0.1 % Apply twice daily for flare ups below neck, maximum 10 days. Patient not taking: Reported on 10/05/2022 08/18/22   Birder Robson, MD      Allergies    Prednisone and Lisinopril    Review of Systems  Review of Systems  All other systems reviewed and are negative.   Physical Exam Updated Vital Signs BP (!) 164/76 (BP Location: Right Arm)   Pulse 93   Temp 99.3 F (37.4 C) (Oral)   Resp 20   Ht 5\' 7"  (1.702 m)   Wt 71.7 kg   SpO2 97%   BMI 24.75 kg/m  Physical Exam Vitals and nursing note reviewed.  Constitutional:      General: She is not in acute distress.    Appearance: She is well-developed.  HENT:     Head: Normocephalic and atraumatic.  Eyes:     Conjunctiva/sclera: Conjunctivae normal.  Cardiovascular:     Rate and Rhythm: Normal  rate and regular rhythm.  Pulmonary:     Effort: Pulmonary effort is normal. No respiratory distress.     Breath sounds: Normal breath sounds. No stridor.  Chest:    Abdominal:     General: There is no distension.  Skin:    General: Skin is warm and dry.  Neurological:     Mental Status: She is alert and oriented to person, place, and time.     Cranial Nerves: No cranial nerve deficit.  Psychiatric:        Mood and Affect: Mood normal.     ED Results / Procedures / Treatments   Labs (all labs ordered are listed, but only abnormal results are displayed) Labs Reviewed  BASIC METABOLIC PANEL - Abnormal; Notable for the following components:      Result Value   Potassium 3.2 (*)    Glucose, Bld 121 (*)    All other components within normal limits  CBC - Abnormal; Notable for the following components:   WBC 11.7 (*)    All other components within normal limits  TROPONIN I (HIGH SENSITIVITY) - Abnormal; Notable for the following components:   Troponin I (High Sensitivity) 183 (*)    All other components within normal limits  D-DIMER, QUANTITATIVE  TROPONIN I (HIGH SENSITIVITY)    EKG EKG Interpretation Date/Time:  Tuesday November 03 2022 13:36:05 EDT Ventricular Rate:  93 PR Interval:  136 QRS Duration:  96 QT Interval:  408 QTC Calculation: 507 R Axis:   34  Text Interpretation: Normal sinus rhythm Possible Anterior infarct , age undetermined ST-t wave abnormality Abnormal ECG Confirmed by Gerhard Munch [phone]) on 11/03/2022 1:58:10 PM  Radiology DG Chest Portable 1 View  Result Date: 11/03/2022 CLINICAL DATA:  Right-sided chest pain EXAM: PORTABLE CHEST 1 VIEW COMPARISON:  03/10/2010 FINDINGS: Heart size is normal. Mediastinal shadows are normal. There are old healed rib fractures on the right. The right lung is clear. There is mild linear scarring on the left adjacent to the heart border. No suspicion of active pneumonia, collapse, edema or effusion. IMPRESSION: No  active disease. Old healed rib fractures on the right. Mild linear scarring on the left. Electronically Signed   By: Paulina Fusi M.D.   On: 11/03/2022 14:00    Procedures Procedures    Medications Ordered in ED Medications  potassium chloride SA (KLOR-CON M) CR tablet 40 mEq (has no administration in time range)  ketorolac (TORADOL) 30 MG/ML injection 30 mg (30 mg Intravenous Given 11/03/22 1456)  sodium chloride 0.9 % bolus 500 mL (0 mLs Intravenous Stopped 11/03/22 1537)  aspirin tablet 325 mg (325 mg Oral Given 11/03/22 1533)  morphine (PF) 4 MG/ML injection 4 mg (4 mg Intravenous Given 11/03/22 1534)    ED Course/ Medical Decision Making/ A&P  Medical Decision Making Adult female with a history of hypertension, diabetes, presents with chest pain, right shoulder pain.  With history of injury, as well as reproducible pain there are some suspicion for musculoskeletal cause, but given the worsening pain with radiation across the anterior chest, additional consideration including ACS, PE as she is a smoker considered. Labs sent. Cardiac 95 sinus normal Pulse ox 100% room air normal   Amount and/or Complexity of Data Reviewed External Data Reviewed: notes.    Details: Urgent care notes and ECG reviewed Labs: ordered. Decision-making details documented in ED Course. Radiology: ordered and independent interpretation performed. Decision-making details documented in ED Course. ECG/medicine tests: ordered and independent interpretation performed. Decision-making details documented in ED Course.  Risk OTC drugs. Prescription drug management. Decision regarding hospitalization. Diagnosis or treatment significantly limited by social determinants of health.   3:45 PM Initial troponin elevated 183.  Patient received aspirin, morphine, starting heparin drip.  She has had brief episodes of pain while in the ED, currently has none. I discussed the patient's case  with Dr. Wyline Mood her cardiologist, and cardiology will see the patient for evaluation.  D-dimer normal, and absent hypoxia, tachypnea low evidence for PE.  With normal D-dimer, no back pain, or neurocomplaints, dissection also less likely.  With concern for elevated troponin, initiation of heparin, patient will be admitted for further monitoring, management.         Final Clinical Impression(s) / ED Diagnoses Final diagnoses:  Unstable angina (HCC)   CRITICAL CARE Performed by: Gerhard Munch Total critical care time: 40 minutes Critical care time was exclusive of separately billable procedures and treating other patients. Critical care was necessary to treat or prevent imminent or life-threatening deterioration. Critical care was time spent personally by me on the following activities: development of treatment plan with patient and/or surrogate as well as nursing, discussions with consultants, evaluation of patient's response to treatment, examination of patient, obtaining history from patient or surrogate, ordering and performing treatments and interventions, ordering and review of laboratory studies, ordering and review of radiographic studies, pulse oximetry and re-evaluation of patient's condition.    Gerhard Munch, MD 11/03/22 [phone]

## 2022-11-03 NOTE — Consult Note (Addendum)
Cardiology Consultation   Patient ID: [name] MRN: [id number]; DOB: 07-05-1960  Admit date: 11/03/2022 Date of Consult: 11/03/2022  PCP:  Tylene Fantasia., PA-C   [facility] Providers Cardiologist:  New1}     Patient Profile:   [name] is a 62 y.o. female with a hx of HTN, tobacco abuse, HLD, DM2 who is being seen 11/03/2022 for the evaluation of chest pain at the request of Dr Ernest Mallick.  History of Present Illness:   [name] 62 yo female history of HTN, tobacco abuse, DM2, HLD presents with right upper chest and arm pain. Starting one week ago started having episodes of 10/10 right upper chest pain descriebd as dull/aching/pressure. Would occur at rest or with activity. Some positional component. Would last hours constant at a time, could be worst with moving arm. Better with taking ibuprofen/acetaminophen. Over the last few days pain began to radiate down right arm and into hand. She denies any other associated symptoms. No SOB/DOE, no N/V, no diaphoresis. In ER pain resolved after IV toradol and morphine, currently pain free.     ER vital p 93 bp 164/76 97% RA K 3.2 WBC 11.7 Hgb 13.2 Plt 303 Ddimer 0.43 Trop 183--> EKG SR, lateral ST depressions no prior to compare.  CXR no acute process  Past Medical History:  Diagnosis Date   Diabetes mellitus without complication (HCC)    Hypertension     Past Surgical History:  Procedure Laterality Date   ANKLE FRACTURE SURGERY Right       Inpatient Medications: Scheduled Meds:  Continuous Infusions:  PRN Meds:   Allergies:    Allergies  Allergen Reactions   Prednisone Rash   Lisinopril Swelling    Social History:   Social History   Socioeconomic History   Marital status: Divorced    Spouse name: Not on file   Number of children: Not on file   Years of education: Not on file   Highest education level: Not on file  Occupational History   Not on file  Tobacco Use   Smoking status: Every  Day    Current packs/day: 0.50    Types: Cigarettes    Passive exposure: Never   Smokeless tobacco: Never  Vaping Use   Vaping status: Never Used  Substance and Sexual Activity   Alcohol use: Not Currently   Drug use: Not Currently   Sexual activity: Not on file  Other Topics Concern   Not on file  Social History Narrative   Not on file   Social Determinants of Health   Financial Resource Strain: Not on file  Food Insecurity: Not on file  Transportation Needs: Not on file  Physical Activity: Not on file  Stress: Not on file  Social Connections: Not on file  Intimate Partner Violence: Not on file    Family History:    Family History  Problem Relation Age of Onset   Diabetes Father      ROS:  Please see the history of present illness.   All other ROS reviewed and negative.     Physical Exam/Data:   Vitals:   11/03/22 1333 11/03/22 1334  BP:  (!) 164/76  Pulse:  93  Resp:  20  Temp:  99.3 F (37.4 C)  TempSrc:  Oral  SpO2:  97%  Weight: 71.7 kg   Height: 5\' 7"  (1.702 m)    No intake or output data in the 24 hours ending 11/03/22 1538  11/03/2022    1:33 PM 10/05/2022    1:39 PM 08/18/2022    2:46 PM  Last 3 Weights  Weight (lbs) 158 lb 166 lb 6 oz 164 lb 11.2 oz  Weight (kg) 71.668 kg 75.467 kg 74.707 kg     Body mass index is 24.75 kg/m.  General:  Well nourished, well developed, in no acute distress HEENT: normal Neck: no JVD Vascular: No carotid bruits; Distal pulses 2+ bilaterally Cardiac:  normal S1, S2; RRR; no murmur  Lungs:  clear to auscultation bilaterally, no wheezing, rhonchi or rales  Abd: soft, nontender, no hepatomegaly  Ext: no edema Musculoskeletal:  slight tenderness in right chest to palpation Skin: warm and dry  Neuro:  CNs 2-12 intact, no focal abnormalities noted Psych:  Normal affect     Laboratory Data:  High Sensitivity Troponin:   Recent Labs  Lab 11/03/22 1356  TROPONINIHS 183*     Chemistry Recent Labs   Lab 11/03/22 1356  NA 137  K 3.2*  CL 103  CO2 24  GLUCOSE 121*  BUN 15  CREATININE 0.49  CALCIUM 9.2  GFRNONAA >60  ANIONGAP 10    No results for input(s): "PROT", "ALBUMIN", "AST", "ALT", "ALKPHOS", "BILITOT" in the last 168 hours. Lipids No results for input(s): "CHOL", "TRIG", "HDL", "LABVLDL", "LDLCALC", "CHOLHDL" in the last 168 hours.  Hematology Recent Labs  Lab 11/03/22 1356  WBC 11.7*  RBC 4.40  HGB 13.2  HCT 38.9  MCV 88.4  MCH 30.0  MCHC 33.9  RDW 13.5  PLT 303   Thyroid No results for input(s): "TSH", "FREET4" in the last 168 hours.  BNPNo results for input(s): "BNP", "PROBNP" in the last 168 hours.  DDimer  Recent Labs  Lab 11/03/22 1356  DDIMER 0.43     Radiology/Studies:  DG Chest Portable 1 View  Result Date: 11/03/2022 CLINICAL DATA:  Right-sided chest pain EXAM: PORTABLE CHEST 1 VIEW COMPARISON:  03/10/2010 FINDINGS: Heart size is normal. Mediastinal shadows are normal. There are old healed rib fractures on the right. The right lung is clear. There is mild linear scarring on the left adjacent to the heart border. No suspicion of active pneumonia, collapse, edema or effusion. IMPRESSION: No active disease. Old healed rib fractures on the right. Mild linear scarring on the left. Electronically Signed   By: Paulina Fusi M.D.   On: 11/03/2022 14:00     Assessment and Plan:   1.Chest pain - very atypical symptoms. Right upper chest down right arm, lasting hours at a time with a positional component, better with prn tylenol and ibuprofen - ddimer negative - mild trop 183, EKG SR lateral ST depressions without an old EKG to compare.  - f/u echo, ESR, CRP - very atypical symptoms but multiple CAD risk factors, at risk for atypical angina given DM2 though this would be highly atypical for angina. She has some mild tenderness to palpation over right chest wall.  - Follow trop, EKG, echo. Make npo tonight in case ischemic testing becomes indicated.  -  started on hep gtt, continue ASA.     For questions or updates, please contact [facility] Please consult [email or web address] for contact info under    Signed, Dina Rich, MD  11/03/2022 3:38 PM

## 2022-11-03 NOTE — ED Notes (Signed)
ED TO INPATIENT HANDOFF REPORT  ED Nurse Name and Phone #:  S Name/Age/Gender [name] 62 y.o. female Room/Bed: APA08/APA08  Code Status   Code Status: Full Code  Home/SNF/Other Home Patient oriented to: self, place, time, and situation Is this baseline? Yes   Triage Complete: Triage complete  Chief Complaint Chest pain [R07.9]  Triage Note Pt with right sided CP yesterday and now across her chest and down right arm.  Denies any SOB.    Allergies Allergies  Allergen Reactions   Prednisone Rash   Lisinopril Swelling    Level of Care/Admitting Diagnosis ED Disposition     ED Disposition  Admit   Condition  --   Comment  Hospital Area: [facility] [[id number]]  Level of Care: Stepdown [14]  Covid Evaluation: Asymptomatic - no recent exposure (last 10 days) testing not required  Diagnosis: Chest pain [161096]  Admitting Physician: Orland Mustard [[id number]]  Attending Physician: Orland Mustard [[id number]]          B Medical/Surgery History Past Medical History:  Diagnosis Date   Diabetes mellitus without complication (HCC)    Hypertension    Past Surgical History:  Procedure Laterality Date   ANKLE FRACTURE SURGERY Right      A IV Location/Drains/Wounds Patient Lines/Drains/Airways Status     Active Line/Drains/Airways     Name Placement date Placement time Site Days   Peripheral IV 11/03/22 20 G Left Antecubital 11/03/22  1453  Antecubital  less than 1            Intake/Output Last 24 hours  Intake/Output Summary (Last 24 hours) at 11/03/2022 1718 Last data filed at 11/03/2022 1537 Gross per 24 hour  Intake 500 ml  Output --  Net 500 ml    Labs/Imaging Results for orders placed or performed during the hospital encounter of 11/03/22 (from the past 48 hour(s))  Basic metabolic panel     Status: Abnormal   Collection Time: 11/03/22  1:56 PM  Result Value Ref Range   Sodium 137 135 - 145 mmol/L   Potassium 3.2 (L) 3.5 - 5.1  mmol/L   Chloride 103 98 - 111 mmol/L   CO2 24 22 - 32 mmol/L   Glucose, Bld 121 (H) 70 - 99 mg/dL    Comment: Glucose reference range applies only to samples taken after fasting for at least 8 hours.   BUN 15 6 - 20 mg/dL   Creatinine, Ser 2.95 0.44 - 1.00 mg/dL   Calcium 9.2 8.9 - 62.1 mg/dL   GFR, Estimated >30 >86 mL/min    Comment: (NOTE) Calculated using the CKD-EPI Creatinine Equation (2021)    Anion gap 10 5 - 15    Comment: Performed at [facility], [address] [place], [place] [address]  CBC     Status: Abnormal   Collection Time: 11/03/22  1:56 PM  Result Value Ref Range   WBC 11.7 (H) 4.0 - 10.5 K/uL   RBC 4.40 3.87 - 5.11 MIL/uL   Hemoglobin 13.2 12.0 - 15.0 g/dL   HCT 96.2 95.2 - 84.1 %   MCV 88.4 80.0 - 100.0 fL   MCH 30.0 26.0 - 34.0 pg   MCHC 33.9 30.0 - 36.0 g/dL   RDW 32.4 40.1 - 02.7 %   Platelets 303 150 - 400 K/uL   nRBC 0.0 0.0 - 0.2 %    Comment: Performed at [facility], [address] [place], [place] [address]  Troponin I (High Sensitivity)  Status: Abnormal   Collection Time: 11/03/22  1:56 PM  Result Value Ref Range   Troponin I (High Sensitivity) 183 (HH) <18 ng/L    Comment: CRITICAL RESULT CALLED TO, READ BACK BY AND VERIFIED WITH Lilit Cinelli,A ON 11/03/22 AT 1450 BY LOY,C (NOTE) Elevated high sensitivity troponin I (hsTnI) values and significant  changes across serial measurements may suggest ACS but many other  chronic and acute conditions are known to elevate hsTnI results.  Refer to the "Links" section for chest pain algorithms and additional  guidance. Performed at [facility], [address], [place], [place] [address]   D-dimer, quantitative     Status: None   Collection Time: 11/03/22  1:56 PM  Result Value Ref Range   D-Dimer, Quant 0.43 0.00 - 0.50 ug/mL-FEU    Comment: (NOTE) At the manufacturer cut-off value of 0.5 g/mL FEU, this assay has a negative predictive value of 95-100%.This assay is intended for use in conjunction  with a clinical pretest probability (PTP) assessment model to exclude pulmonary embolism (PE) and deep venous thrombosis (DVT) in outpatients suspected of PE or DVT. Results should be correlated with clinical presentation. Performed at [facility], [address], [place], [place] [address]   Troponin I (High Sensitivity)     Status: Abnormal   Collection Time: 11/03/22  3:37 PM  Result Value Ref Range   Troponin I (High Sensitivity) 212 (HH) <18 ng/L    Comment: DELTA CHECK NOTED CRITICAL VALUE NOTED. VALUE IS CONSISTENT WITH PREVIOUSLY REPORTED/CALLED VALUE (NOTE) Elevated high sensitivity troponin I (hsTnI) values and significant  changes across serial measurements may suggest ACS but many other  chronic and acute conditions are known to elevate hsTnI results.  Refer to the "Links" section for chest pain algorithms and additional  guidance. Performed at [facility], [address], [place], [place] [address]   CBG monitoring, ED     Status: Abnormal   Collection Time: 11/03/22  5:16 PM  Result Value Ref Range   Glucose-Capillary 102 (H) 70 - 99 mg/dL    Comment: Glucose reference range applies only to samples taken after fasting for at least 8 hours.   DG Chest Portable 1 View  Result Date: 11/03/2022 CLINICAL DATA:  Right-sided chest pain EXAM: PORTABLE CHEST 1 VIEW COMPARISON:  03/10/2010 FINDINGS: Heart size is normal. Mediastinal shadows are normal. There are old healed rib fractures on the right. The right lung is clear. There is mild linear scarring on the left adjacent to the heart border. No suspicion of active pneumonia, collapse, edema or effusion. IMPRESSION: No active disease. Old healed rib fractures on the right. Mild linear scarring on the left. Electronically Signed   By: Paulina Fusi M.D.   On: 11/03/2022 14:00    Pending Labs Unresulted Labs (From admission, onward)     Start     Ordered   11/04/22 0500  Heparin level (unfractionated)  Daily,   R      11/03/22  1554   11/04/22 0500  CBC  Daily,   R      11/03/22 1554   11/04/22 0500  Sedimentation rate  Tomorrow morning,   R        11/03/22 1611   11/04/22 0500  C-reactive protein  Tomorrow morning,   R        11/03/22 1611   11/04/22 0500  Lipid panel  Tomorrow morning,   R        11/03/22 1618   11/04/22 0500  Hemoglobin A1c  Tomorrow morning,   R        11/03/22 1618   11/04/22 0500  Basic metabolic panel  Tomorrow morning,   R        11/03/22 1714   11/04/22 0500  Lipoprotein A (LPA)  Tomorrow morning,   R        11/03/22 1716   11/03/22 2200  Heparin level (unfractionated)  ONCE - URGENT,   URGENT        11/03/22 1554   11/03/22 1714  HIV Antibody (routine testing w rflx)  (HIV Antibody (Routine testing w reflex) panel)  Once,   R        11/03/22 1714   11/03/22 1714  Hepatic function panel  Once,   R        11/03/22 1714   11/03/22 1714  TSH  Once,   R        11/03/22 1714   11/03/22 1645  Magnesium  Once,   R        11/03/22 1645   11/03/22 1616  Rapid urine drug screen (hospital performed)  ONCE - STAT,   STAT        11/03/22 1615            Vitals/Pain Today's Vitals   11/03/22 1333 11/03/22 1334 11/03/22 1626  BP:  (!) 164/76   Pulse:  93   Resp:  20   Temp:  99.3 F (37.4 C)   TempSrc:  Oral   SpO2:  97%   Weight: 71.7 kg    Height: 5\' 7"  (1.702 m)    PainSc: 10-Worst pain ever  3     Isolation Precautions No active isolations  Medications Medications  potassium chloride SA (KLOR-CON M) CR tablet 40 mEq (40 mEq Oral Given 11/03/22 1625)  heparin bolus via infusion 4,000 Units (4,000 Units Intravenous Bolus from Bag 11/03/22 1708)    Followed by  heparin ADULT infusion 100 units/mL (25000 units/248mL) (900 Units/hr Intravenous New Bag/Given 11/03/22 1705)  nicotine (NICODERM CQ - dosed in mg/24 hours) patch 14 mg (14 mg Transdermal Patch Applied 11/03/22 1655)  insulin aspart (novoLOG) injection 0-15 Units (has no administration in time range)  acetaminophen  (TYLENOL) tablet 650 mg (has no administration in time range)    Or  acetaminophen (TYLENOL) suppository 650 mg (has no administration in time range)  HYDROcodone-acetaminophen (NORCO/VICODIN) 5-325 MG per tablet 1-2 tablet (has no administration in time range)  aspirin EC tablet 81 mg (has no administration in time range)  nitroGLYCERIN (NITROSTAT) SL tablet 0.4 mg (has no administration in time range)  ondansetron (ZOFRAN) injection 4 mg (has no administration in time range)  metoprolol succinate (TOPROL-XL) 24 hr tablet 12.5 mg (has no administration in time range)  ketorolac (TORADOL) 30 MG/ML injection 30 mg (30 mg Intravenous Given 11/03/22 1456)  sodium chloride 0.9 % bolus 500 mL (0 mLs Intravenous Stopped 11/03/22 1537)  aspirin tablet 325 mg (325 mg Oral Given 11/03/22 1533)  morphine (PF) 4 MG/ML injection 4 mg (4 mg Intravenous Given 11/03/22 1534)    Mobility walks     Focused Assessments Cardiac Assessment Handoff:    No results found for: "CKTOTAL", "CKMB", "CKMBINDEX", "TROPONINI" Lab Results  Component Value Date   DDIMER 0.43 11/03/2022   Does the Patient currently have chest pain? Yes    R Recommendations: See Admitting Provider Note  Report given to:   Additional Notes:

## 2022-11-03 NOTE — Assessment & Plan Note (Signed)
Lipid panel pending Continue zocor for now 20mg  daily

## 2022-11-03 NOTE — Assessment & Plan Note (Addendum)
62 year old female presenting with one week history of right sided chest pain radiating down right arm.  Atypical presentation with significant risk factors -Troponin elevated in the 260-270 range; EKG has demonstrated lateral ST depressions without prior EKG for comparison.   -Medical management with heparin drip, metoprolol, statin and aspirin provided; case discussed with cardiology services who felt that the best treatment/benefit for the patient a cardiac cath will be pursued. -Patient will be kept n.p.o. and patient transferred to [facility] for cardiac cath. -2D echo has demonstrated ejection fraction 55 to 60%, no wall motion abnormalities or significant valvular disorder. -Continue to follow any further recommendations by cardiology service.

## 2022-11-03 NOTE — ED Provider Notes (Signed)
  Physical Exam  BP (!) 164/76 (BP Location: Right Arm)   Pulse 93   Temp 99.3 F (37.4 C) (Oral)   Resp 20   Ht 5\' 7"  (1.702 m)   Wt 71.7 kg   SpO2 97%   BMI 24.75 kg/m   Physical Exam  Procedures  Procedures  ED Course / MDM   Clinical Course as of 11/03/22 1636  Tue Nov 03, 2022  1600 Assumed care from Dr. Jeraldine Loots.  62 year old female who presents to the emergency department with substernal chest pain for several days.  EKG shows nonspecific ST changes and initial troponin was 183.  Was given aspirin and started on heparin.  Also has been given Toradol and morphine for pain.  Dr. Wyline Mood will come evaluate the patient shortly for admission and/or intervention.  Also had D-dimer that was WNL. [RP]  1611 Per Dr. Wyline Mood can be admitted to hospitalist here at [facility].  Says that she should continue the heparin and they will evaluate her for cath in the morning. [RP]  1635 Dr. Stann Mainland from hospitalist will admit the patient.  Repeat troponin 212. [RP]    Clinical Course User Index [RP] Rondel Baton, MD   Medical Decision Making Amount and/or Complexity of Data Reviewed Labs: ordered. Radiology: ordered.  Risk OTC drugs. Prescription drug management. Decision regarding hospitalization.      Rondel Baton, MD 11/03/22 [phone]

## 2022-11-03 NOTE — Assessment & Plan Note (Signed)
No recent A1C and none in system A1C pending Accuchecks qac/hs and moderate SSI  Hold metformin

## 2022-11-03 NOTE — Assessment & Plan Note (Addendum)
-  Repleted/stabilized -Continue to follow electrolytes trends.

## 2022-11-03 NOTE — ED Notes (Signed)
Unsuccessful attempt to call report 

## 2022-11-03 NOTE — ED Triage Notes (Signed)
Pt with right sided CP yesterday and now across her chest and down right arm.  Denies any SOB.

## 2022-11-04 ENCOUNTER — Observation Stay (HOSPITAL_COMMUNITY): Payer: Medicaid Other

## 2022-11-04 ENCOUNTER — Encounter (HOSPITAL_COMMUNITY)
Admission: EM | Disposition: A | Discharge status: Home / Self Care | Payer: Self-pay | Source: Home / Self Care | Attending: Internal Medicine

## 2022-11-04 DIAGNOSIS — E119 Type 2 diabetes mellitus without complications: Secondary | ICD-10-CM | POA: Diagnosis present

## 2022-11-04 DIAGNOSIS — Z833 Family history of diabetes mellitus: Secondary | ICD-10-CM | POA: Diagnosis not present

## 2022-11-04 DIAGNOSIS — E876 Hypokalemia: Secondary | ICD-10-CM | POA: Diagnosis present

## 2022-11-04 DIAGNOSIS — R079 Chest pain, unspecified: Secondary | ICD-10-CM

## 2022-11-04 DIAGNOSIS — I214 Non-ST elevation (NSTEMI) myocardial infarction: Secondary | ICD-10-CM

## 2022-11-04 DIAGNOSIS — E782 Mixed hyperlipidemia: Secondary | ICD-10-CM | POA: Diagnosis present

## 2022-11-04 DIAGNOSIS — I10 Essential (primary) hypertension: Secondary | ICD-10-CM

## 2022-11-04 DIAGNOSIS — Z79899 Other long term (current) drug therapy: Secondary | ICD-10-CM | POA: Diagnosis not present

## 2022-11-04 DIAGNOSIS — Z720 Tobacco use: Secondary | ICD-10-CM

## 2022-11-04 DIAGNOSIS — I2510 Atherosclerotic heart disease of native coronary artery without angina pectoris: Secondary | ICD-10-CM | POA: Diagnosis present

## 2022-11-04 DIAGNOSIS — Z8249 Family history of ischemic heart disease and other diseases of the circulatory system: Secondary | ICD-10-CM | POA: Diagnosis not present

## 2022-11-04 DIAGNOSIS — E785 Hyperlipidemia, unspecified: Secondary | ICD-10-CM

## 2022-11-04 DIAGNOSIS — R7989 Other specified abnormal findings of blood chemistry: Secondary | ICD-10-CM

## 2022-11-04 DIAGNOSIS — Z23 Encounter for immunization: Secondary | ICD-10-CM | POA: Diagnosis not present

## 2022-11-04 DIAGNOSIS — F17210 Nicotine dependence, cigarettes, uncomplicated: Secondary | ICD-10-CM | POA: Diagnosis present

## 2022-11-04 DIAGNOSIS — Z888 Allergy status to other drugs, medicaments and biological substances status: Secondary | ICD-10-CM | POA: Diagnosis not present

## 2022-11-04 DIAGNOSIS — D72829 Elevated white blood cell count, unspecified: Secondary | ICD-10-CM

## 2022-11-04 DIAGNOSIS — Z7984 Long term (current) use of oral hypoglycemic drugs: Secondary | ICD-10-CM | POA: Diagnosis not present

## 2022-11-04 DIAGNOSIS — I200 Unstable angina: Secondary | ICD-10-CM | POA: Diagnosis present

## 2022-11-04 HISTORY — PX: LEFT HEART CATH AND CORONARY ANGIOGRAPHY: CATH118249

## 2022-11-04 HISTORY — PX: CORONARY STENT INTERVENTION: CATH118234

## 2022-11-04 LAB — TROPONIN I (HIGH SENSITIVITY): Troponin I (High Sensitivity): 274 ng/L (ref <18)

## 2022-11-04 LAB — GLUCOSE, CAPILLARY
Glucose-Capillary: 141 mg/dL — ABNORMAL HIGH (ref 70–99)
Glucose-Capillary: 111 mg/dL — ABNORMAL HIGH (ref 70–99)
Glucose-Capillary: 105 mg/dL — ABNORMAL HIGH (ref 70–99)
Glucose-Capillary: 115 mg/dL — ABNORMAL HIGH (ref 70–99)

## 2022-11-04 LAB — POCT ACTIVATED CLOTTING TIME: Activated Clotting Time: 318 seconds

## 2022-11-04 LAB — HEPARIN LEVEL (UNFRACTIONATED): Heparin Unfractionated: 0.17 IU/mL — ABNORMAL LOW (ref 0.30–0.70)

## 2022-11-04 SURGERY — LEFT HEART CATH AND CORONARY ANGIOGRAPHY
Anesthesia: LOCAL

## 2022-11-04 MED ORDER — TICAGRELOR 90 MG PO TABS: 90.0000 mg | ORAL_TABLET | Freq: Two times a day (BID) | ORAL | Status: DC

## 2022-11-04 MED ORDER — MIDAZOLAM HCL 2 MG/2ML IJ SOLN
INTRAMUSCULAR | Status: DC | PRN
  Administered 2022-11-04: 1 mg via INTRAVENOUS
  Administered 2022-11-04: 2 mg via INTRAVENOUS

## 2022-11-04 MED ORDER — SODIUM CHLORIDE 0.9 % IV SOLN: INTRAVENOUS | Status: DC

## 2022-11-04 MED ORDER — SODIUM CHLORIDE 0.9% FLUSH: 3.0000 mL | INTRAVENOUS | Status: DC | PRN

## 2022-11-04 MED ORDER — FENTANYL CITRATE (PF) 100 MCG/2ML IJ SOLN
INTRAMUSCULAR | Status: DC | PRN
  Administered 2022-11-04 (×2): 50 ug via INTRAVENOUS

## 2022-11-04 MED ORDER — HEPARIN (PORCINE) IN NACL 1000-0.9 UT/500ML-% IV SOLN
INTRAVENOUS | Status: DC | PRN
  Administered 2022-11-04 (×2): 500 mL

## 2022-11-04 MED ORDER — ATORVASTATIN CALCIUM 40 MG PO TABS: 40.0000 mg | ORAL_TABLET | Freq: Every evening | ORAL | Status: DC

## 2022-11-04 MED ORDER — SODIUM CHLORIDE 0.9 % IV SOLN: 250.0000 mL | INTRAVENOUS | Status: DC | PRN

## 2022-11-04 MED ORDER — MIDAZOLAM HCL 2 MG/2ML IJ SOLN
INTRAMUSCULAR | Status: AC
  Filled 2022-11-04: qty 2

## 2022-11-04 MED ORDER — VERAPAMIL HCL 2.5 MG/ML IV SOLN
INTRAVENOUS | Status: AC
  Filled 2022-11-04: qty 2

## 2022-11-04 MED ORDER — FENTANYL CITRATE (PF) 100 MCG/2ML IJ SOLN
INTRAMUSCULAR | Status: AC
  Filled 2022-11-04: qty 2

## 2022-11-04 MED ORDER — TICAGRELOR 90 MG PO TABS
ORAL_TABLET | ORAL | Status: AC
  Filled 2022-11-04: qty 2

## 2022-11-04 MED ORDER — HEPARIN BOLUS VIA INFUSION
2000.0000 [IU] | Freq: Once | INTRAVENOUS | Status: AC
  Administered 2022-11-04: 2000 [IU] via INTRAVENOUS
  Filled 2022-11-04: qty 2000

## 2022-11-04 MED ORDER — VERAPAMIL HCL 2.5 MG/ML IV SOLN
INTRAVENOUS | Status: DC | PRN
  Administered 2022-11-04: 10 mL via INTRA_ARTERIAL

## 2022-11-04 MED ORDER — TICAGRELOR 90 MG PO TABS
ORAL_TABLET | ORAL | Status: DC | PRN
  Administered 2022-11-04: 180 mg via ORAL

## 2022-11-04 MED ORDER — LABETALOL HCL 5 MG/ML IV SOLN: 10.0000 mg | INTRAVENOUS | Status: AC | PRN

## 2022-11-04 MED ORDER — HEPARIN SODIUM (PORCINE) 1000 UNIT/ML IJ SOLN
INTRAMUSCULAR | Status: AC
  Filled 2022-11-04: qty 10

## 2022-11-04 MED ORDER — SODIUM CHLORIDE 0.9 % IV SOLN: INTRAVENOUS | Status: AC

## 2022-11-04 MED ORDER — SODIUM CHLORIDE 0.9% FLUSH
3.0000 mL | Freq: Two times a day (BID) | INTRAVENOUS | Status: DC
  Administered 2022-11-04: 3 mL via INTRAVENOUS

## 2022-11-04 MED ORDER — IOHEXOL 350 MG/ML SOLN
INTRAVENOUS | Status: DC | PRN
  Administered 2022-11-04: 75 mL

## 2022-11-04 MED ORDER — HEPARIN SODIUM (PORCINE) 1000 UNIT/ML IJ SOLN
INTRAMUSCULAR | Status: DC | PRN
  Administered 2022-11-04: 5000 [IU] via INTRAVENOUS
  Administered 2022-11-04: 4000 [IU] via INTRAVENOUS

## 2022-11-04 MED ORDER — LIDOCAINE HCL (PF) 1 % IJ SOLN
INTRAMUSCULAR | Status: AC
  Filled 2022-11-04: qty 30

## 2022-11-04 MED ORDER — HYDRALAZINE HCL 20 MG/ML IJ SOLN: 10.0000 mg | INTRAMUSCULAR | Status: AC | PRN

## 2022-11-04 MED ORDER — TICAGRELOR 90 MG PO TABS
90.0000 mg | ORAL_TABLET | Freq: Two times a day (BID) | ORAL | Status: DC
  Administered 2022-11-04: 90 mg via ORAL
  Filled 2022-11-04: qty 1

## 2022-11-04 SURGICAL SUPPLY — 17 items
BALLN EMERGE MR 2.5X12 (BALLOONS) ×1
BALLN ~~LOC~~ EMERGE MR 3.25X15 (BALLOONS) ×1
BALLOON EMERGE MR 2.5X12 (BALLOONS) IMPLANT
BALLOON ~~LOC~~ EMERGE MR 3.25X15 (BALLOONS) IMPLANT
CATH 5FR JL3.5 JR4 ANG PIG MP (CATHETERS) IMPLANT
CATH VISTA GUIDE 6FR JR4 (CATHETERS) IMPLANT
DEVICE RAD COMP TR BAND LRG (VASCULAR PRODUCTS) IMPLANT
GLIDESHEATH SLEND SS 6F .021 (SHEATH) IMPLANT
GUIDEWIRE INQWIRE 1.5J.035X260 (WIRE) IMPLANT
INQWIRE 1.5J .035X260CM (WIRE) ×1
KIT ENCORE 26 ADVANTAGE (KITS) IMPLANT
KIT HEART LEFT (KITS) ×2 IMPLANT
PACK CARDIAC CATHETERIZATION (CUSTOM PROCEDURE TRAY) ×2 IMPLANT
SET ATX-X65L (MISCELLANEOUS) IMPLANT
STENT SYNERGY XD 3.0X24 (Permanent Stent) IMPLANT
SYNERGY XD 3.0X24 (Permanent Stent) ×1 IMPLANT
WIRE COUGAR XT STRL 190CM (WIRE) IMPLANT

## 2022-11-04 NOTE — Interval H&P Note (Signed)
History and Physical Interval Note:  11/04/2022 3:42 PM  [name]  has presented today for surgery, with the diagnosis of nstemi.  The various methods of treatment have been discussed with the patient and family. After consideration of risks, benefits and other options for treatment, the patient has consented to  Procedure(s): LEFT HEART CATH AND CORONARY ANGIOGRAPHY (N/A) as a surgical intervention.  The patient's history has been reviewed, patient examined, no change in status, stable for surgery.  I have reviewed the patient's chart and labs.  Questions were answered to the patient's satisfaction.    Cath Lab Visit (complete for each Cath Lab visit)  Clinical Evaluation Leading to the Procedure:   ACS: Yes.    Non-ACS:    Anginal Classification: CCS III  Anti-ischemic medical therapy: No Therapy  Non-Invasive Test Results: No non-invasive testing performed  Prior CABG: No previous CABG        Verne Carrow

## 2022-11-04 NOTE — Progress Notes (Signed)
Rounding Note    Patient Name: [name] Date of Encounter: 11/04/2022  [facility] Cardiologist: Howell Rucks  Subjective   No complaints  Inpatient Medications    Scheduled Meds:  aspirin EC  81 mg Oral Daily   Chlorhexidine Gluconate Cloth  6 each Topical Q0600   heparin  2,000 Units Intravenous Once   hydrOXYzine  25 mg Oral QHS   insulin aspart  0-15 Units Subcutaneous TID WC   loratadine  10 mg Oral BID   metoprolol succinate  12.5 mg Oral Daily   nicotine  14 mg Transdermal Once   pneumococcal 20-valent conjugate vaccine  0.5 mL Intramuscular Tomorrow-1000   simvastatin  20 mg Oral QHS   Continuous Infusions:  heparin 1,050 Units/hr (11/04/22 0700)   PRN Meds: acetaminophen **OR** acetaminophen, HYDROcodone-acetaminophen, nitroGLYCERIN, ondansetron (ZOFRAN) IV, mouth rinse, traZODone   Vital Signs    Vitals:   11/04/22 0500 11/04/22 0600 11/04/22 0700 11/04/22 0800  BP: (!) 124/43 (!) 149/55 (!) 141/56 (!) 144/51  Pulse: 64 71 73   Resp: 18 16 13 17   Temp:   97.9 F (36.6 C)   TempSrc:   Oral   SpO2: 91% 92% 92%   Weight:      Height:        Intake/Output Summary (Last 24 hours) at 11/04/2022 0942 Last data filed at 11/04/2022 0700 Gross per 24 hour  Intake 697.29 ml  Output --  Net 697.29 ml      11/04/2022    4:42 AM 11/03/2022    1:33 PM 10/05/2022    1:39 PM  Last 3 Weights  Weight (lbs) 160 lb 15 oz 158 lb 166 lb 6 oz  Weight (kg) 73 kg 71.668 kg 75.467 kg      Telemetry    NSR - Personally Reviewed  ECG    N/a - Personally Reviewed  Physical Exam   GEN: No acute distress.   Neck: No JVD Cardiac: RRR, no murmurs, rubs, or gallops.  Respiratory: Clear to auscultation bilaterally. GI: Soft, nontender, non-distended  MS: No edema; No deformity. Neuro:  Nonfocal  Psych: Normal affect   Labs    High Sensitivity Troponin:   Recent Labs  Lab 11/03/22 1356 11/03/22 1537 11/03/22 1733 11/03/22 1857 11/04/22 0436   TROPONINIHS 183* 212* 239* 244* 274*     Chemistry Recent Labs  Lab 11/03/22 1356 11/03/22 1537 11/03/22 1733 11/04/22 0430  NA 137  --   --  139  K 3.2*  --   --  4.3  CL 103  --   --  107  CO2 24  --   --  22  GLUCOSE 121*  --   --  118*  BUN 15  --   --  13  CREATININE 0.49  --   --  0.56  CALCIUM 9.2  --   --  9.0  MG  --  1.7  --   --   PROT  --   --  7.3  --   ALBUMIN  --   --  4.1  --   AST  --   --  18  --   ALT  --   --  14  --   ALKPHOS  --   --  56  --   BILITOT  --   --  0.6  --   GFRNONAA >60  --   --  >60  ANIONGAP 10  --   --  10    Lipids  Recent Labs  Lab 11/04/22 0430  CHOL 178  TRIG 142  HDL 44  LDLCALC 106*  CHOLHDL 4.0    Hematology Recent Labs  Lab 11/03/22 1356 11/04/22 0430  WBC 11.7* 10.2  RBC 4.40 4.06  HGB 13.2 12.0  HCT 38.9 36.2  MCV 88.4 89.2  MCH 30.0 29.6  MCHC 33.9 33.1  RDW 13.5 13.7  PLT 303 293   Thyroid  Recent Labs  Lab 11/03/22 1733  TSH 0.978    BNPNo results for input(s): "BNP", "PROBNP" in the last 168 hours.  DDimer  Recent Labs  Lab 11/03/22 1356  DDIMER 0.43     Radiology    ECHOCARDIOGRAM COMPLETE  Result Date: 11/04/2022    ECHOCARDIOGRAM REPORT   Patient Name:   [name] Date of Exam: 11/04/2022 Medical Rec #:  [id number]    Height:       67.0 in Accession #:    [id number]   Weight:       160.9 lb Date of Birth:  1960-10-17   BSA:          1.844 m Patient Age:    62 years     BP:           144/51 mmHg Patient Gender: F            HR:           67 bpm. [facility] Procedure: 2D Echo, 3D Echo, Color Doppler and Cardiac Doppler Indications:    Chest Pain R07.9  History:        Patient has no prior history of Echocardiogram examinations.                 Signs/Symptoms:Chest Pain; Risk Factors:Diabetes, Hypertension,                 Dyslipidemia and Current Smoker.  Sonographer:    Aron Baba Referring Phys: [id number] Dorothe Pea Aiman Noe  Sonographer Comments: Image acquisition  challenging due to respiratory motion. IMPRESSIONS  1. Left ventricular ejection fraction, by estimation, is 55 to 60%. The left ventricle has normal function. The left ventricle has no regional wall motion abnormalities. There is mild left ventricular hypertrophy. Left ventricular diastolic parameters were normal.  2. Right ventricular systolic function is normal. The right ventricular size is normal. Tricuspid regurgitation signal is inadequate for assessing PA pressure.  3. The mitral valve is normal in structure. Trivial mitral valve regurgitation. No evidence of mitral stenosis.  4. The aortic valve has an indeterminant number of cusps. Aortic valve regurgitation is not visualized. No aortic stenosis is present.  5. The inferior vena cava is normal in size with greater than 50% respiratory variability, suggesting right atrial pressure of 3 mmHg. FINDINGS  Left Ventricle: Left ventricular ejection fraction, by estimation, is 55 to 60%. The left ventricle has normal function. The left ventricle has no regional wall motion abnormalities. The left ventricular internal cavity size was normal in size. There is  mild left ventricular hypertrophy. Left ventricular diastolic parameters were normal. Right Ventricle: The right ventricular size is normal. Right vetricular wall thickness was not well visualized. Right ventricular systolic function is normal. Tricuspid regurgitation signal is inadequate for assessing PA pressure. Left Atrium: Left atrial size was normal in size. Right Atrium: Right atrial size was normal in size. Pericardium: There is no evidence of pericardial effusion. Mitral Valve: The mitral valve is normal in structure. Trivial mitral  valve regurgitation. No evidence of mitral valve stenosis. Tricuspid Valve: The tricuspid valve is normal in structure. Tricuspid valve regurgitation is trivial. No evidence of tricuspid stenosis. Aortic Valve: The aortic valve has an indeterminant number of cusps. Aortic  valve regurgitation is not visualized. No aortic stenosis is present. Aortic valve mean gradient measures 4.0 mmHg. Aortic valve peak gradient measures 7.0 mmHg. Aortic valve area, by VTI measures 2.77 cm. Pulmonic Valve: The pulmonic valve was not well visualized. Pulmonic valve regurgitation is not visualized. No evidence of pulmonic stenosis. Aorta: The aortic root is normal in size and structure. Venous: The inferior vena cava is normal in size with greater than 50% respiratory variability, suggesting right atrial pressure of 3 mmHg. IAS/Shunts: No atrial level shunt detected by color flow Doppler.  LEFT VENTRICLE PLAX 2D LVIDd:         4.70 cm   Diastology LVIDs:         3.30 cm   LV e' medial:    7.61 cm/s LV PW:         1.10 cm   LV E/e' medial:  11.6 LV IVS:        1.10 cm   LV e' lateral:   9.77 cm/s LVOT diam:     2.20 cm   LV E/e' lateral: 9.0 LV SV:         88 LV SV Index:   48 LVOT Area:     3.80 cm                           3D Volume EF:                          3D EF:        57 %                          LV EDV:       122 ml                          LV ESV:       53 ml                          LV SV:        69 ml RIGHT VENTRICLE RV S prime:     14.70 cm/s TAPSE (M-mode): 2.4 cm LEFT ATRIUM           Index        RIGHT ATRIUM           Index LA diam:      3.20 cm 1.74 cm/m   RA Area:     16.40 cm LA Vol (A2C): 56.2 ml 30.48 ml/m  RA Volume:   39.20 ml  21.26 ml/m LA Vol (A4C): 27.7 ml 15.02 ml/m  AORTIC VALVE AV Area (Vmax):    2.84 cm AV Area (Vmean):   2.50 cm AV Area (VTI):     2.77 cm AV Vmax:           132.48 cm/s AV Vmean:          92.213 cm/s AV VTI:            0.318 m AV Peak Grad:      7.0 mmHg AV Mean Grad:  4.0 mmHg LVOT Vmax:         99.00 cm/s LVOT Vmean:        60.600 cm/s LVOT VTI:          0.232 m LVOT/AV VTI ratio: 0.73  AORTA Ao Root diam: 3.40 cm Ao Asc diam:  3.00 cm MITRAL VALVE MV Area (PHT): 4.06 cm    SHUNTS MV Decel Time: 187 msec    Systemic VTI:  0.23 m MR Peak  grad: 16.4 mmHg    Systemic Diam: 2.20 cm MR Vmax:      202.50 cm/s MV E velocity: 88.40 cm/s MV A velocity: 75.80 cm/s MV E/A ratio:  1.17 Dina Rich MD Electronically signed by Dina Rich MD Signature Date/Time: 11/04/2022/9:41:45 AM    Final    DG Chest Portable 1 View  Result Date: 11/03/2022 CLINICAL DATA:  Right-sided chest pain EXAM: PORTABLE CHEST 1 VIEW COMPARISON:  03/10/2010 FINDINGS: Heart size is normal. Mediastinal shadows are normal. There are old healed rib fractures on the right. The right lung is clear. There is mild linear scarring on the left adjacent to the heart border. No suspicion of active pneumonia, collapse, edema or effusion. IMPRESSION: No active disease. Old healed rib fractures on the right. Mild linear scarring on the left. Electronically Signed   By: Paulina Fusi M.D.   On: 11/03/2022 14:00    Cardiac Studies    Patient Profile     [name] is a 62 y.o. female with a hx of HTN, tobacco abuse, HLD, DM2 who is being seen 11/03/2022 for the evaluation of chest pain at the request of Dr Ernest Mallick.   Assessment & Plan    1.NSTEMI - very atypical symptoms. Right upper chest down right arm, lasting hours at a time with a positional component, better with prn tylenol and ibuprofen - ddimer negative - trop is elevated, 274 without clear peak. EKG lateral ST depressions without prior EKG for comparison - echo LVEF 55-60%, no WMAs  -despite very atypical symptoms she has multiple CAD risk factors including DM2, HTN, HLD, tobacco history. She has elevated trop and abnormal EKG. Certaintly in a female diabetic patient at risk for atypical angina. Will plan for cath today  - medical therapy with ASA 81, hep gtt, toprol 12.5. Change her simva to atorva 40mg . ACEi allergy, if CAD confirmed could add ARB  Will ask hospitalist to arrange transfer to med team, we will arrange cath.        For questions or updates, please contact [facility] Please  consult [email or web address] for contact info under        Signed, Dina Rich, MD  11/04/2022, 9:42 AM

## 2022-11-04 NOTE — Progress Notes (Signed)
ANTICOAGULATION CONSULT NOTE   Pharmacy Consult for Heparin Indication: chest pain/ACS  Allergies  Allergen Reactions   Prednisone Rash   Lisinopril Swelling    Patient Measurements: Height: 5\' 7"  (170.2 cm) Weight: 73 kg (160 lb 15 oz) IBW/kg (Calculated) : 61.6 HEPARIN DW (KG): 71.7   Vital Signs: Temp: 97.9 F (36.6 C) (07/31 0700) Temp Source: Oral (07/31 0700) BP: 144/51 (07/31 0800) Pulse Rate: 73 (07/31 0700)  Labs: Recent Labs    11/03/22 1356 11/03/22 1537 11/03/22 1733 11/03/22 1857 11/03/22 2131 11/04/22 0430 11/04/22 0436 11/04/22 0805  HGB 13.2  --   --   --   --  12.0  --   --   HCT 38.9  --   --   --   --  36.2  --   --   PLT 303  --   --   --   --  293  --   --   HEPARINUNFRC  --   --   --   --  0.11*  --   --  0.17*  CREATININE 0.49  --   --   --   --  0.56  --   --   TROPONINIHS 183*   < > 239* 244*  --   --  274*  --    < > = values in this interval not displayed.    Estimated Creatinine Clearance: 72.7 mL/min (by C-G formula based on SCr of 0.56 mg/dL).   Medical History: Past Medical History:  Diagnosis Date   Diabetes mellitus without complication (HCC)    Hypertension     Medications:  See med rec  Assessment: 62 y.o. female with a hx of HTN, tobacco abuse, HLD, DM2 who presented to ED with chest pain. Patient not on oral anticoagulation. Pharmacy consulted to start Heparin  HL 0.11> 0.17, remains subtherapeutic  Goal of Therapy:  Heparin level 0.3-0.7 units/ml Monitor platelets by anticoagulation protocol: Yes   Plan:  Heparin 2000 units bolus Increase heparin infusion at 1250 units/hr Check anti-Xa level in ~6 hours and daily while on heparin Continue to monitor H&H and plateletsl  Elder Cyphers, BS Pharm D, BCPS Clinical Pharmacist

## 2022-11-04 NOTE — Progress Notes (Signed)
Received patient from [facility] via [facility].  Pt alert and oriented X4, skin warm and dry, resp even and unlabored. Denis any chest pain or discomfort at this time.  Pt placed on monitor , consent signed, IVF infusing in left AC with  NS and Heparin. Pt waiting for cath procedure and call bell in reach.[name]

## 2022-11-04 NOTE — Progress Notes (Signed)
  Echocardiogram 2D Echocardiogram has been performed.  Maren Reamer 11/04/2022, 9:20 AM

## 2022-11-04 NOTE — Progress Notes (Signed)
ANTICOAGULATION CONSULT NOTE   Pharmacy Consult for Heparin Indication: chest pain/ACS  Allergies  Allergen Reactions   Prednisone Rash   Lisinopril Swelling    Patient Measurements: Height: 5\' 7"  (170.2 cm) Weight: 71.7 kg (158 lb) IBW/kg (Calculated) : 61.6 HEPARIN DW (KG): 71.7   Vital Signs: Temp: 98 F (36.7 C) (07/30 2317) Temp Source: Oral (07/30 2317) BP: 109/48 (07/30 2300) Pulse Rate: 63 (07/30 2300)  Labs: Recent Labs    11/03/22 1356 11/03/22 1537 11/03/22 1733 11/03/22 1857 11/03/22 2131  HGB 13.2  --   --   --   --   HCT 38.9  --   --   --   --   PLT 303  --   --   --   --   HEPARINUNFRC  --   --   --   --  0.11*  CREATININE 0.49  --   --   --   --   TROPONINIHS 183* 212* 239* 244*  --     Estimated Creatinine Clearance: 72.7 mL/min (by C-G formula based on SCr of 0.49 mg/dL).   Medical History: Past Medical History:  Diagnosis Date   Diabetes mellitus without complication (HCC)    Hypertension     Medications:  See med rec  Assessment: 62 y.o. female with a hx of HTN, tobacco abuse, HLD, DM2 who presented to ED with chest pain. Patient not on oral anticoagulation. Pharmacy consulted to start Heparin  7/31 AM update:  Heparin level sub-therapeutic   Goal of Therapy:  Heparin level 0.3-0.7 units/ml Monitor platelets by anticoagulation protocol: Yes   Plan:  Heparin 2000 units bolus Inc heparin to 1050 units/hr 0800 heparin level  Abran Duke, PharmD, BCPS Clinical Pharmacist Phone: [phone]

## 2022-11-04 NOTE — Progress Notes (Signed)
   11/04/22 1047  TOC Brief Assessment  Insurance and Status Reviewed  Patient has primary care physician Yes  Home environment has been reviewed Home with relatives  Prior level of function: independent  Prior/Current Home Services No current home services  Social Determinants of Health Reivew SDOH reviewed no interventions necessary  Readmission risk has been reviewed Yes  Transition of care needs no transition of care needs at this time   In OBS - Cardiology work up , possible stress test. TOC following

## 2022-11-04 NOTE — Progress Notes (Signed)
Progress Note   Patient: [name]:[id number] DOB: 10-17-1960 DOA: 11/03/2022     0 DOS: the patient was seen and examined on 11/04/2022   Brief hospital admission narrative: As per HPI written by Dr. Artis Flock on 11/03/2022 [name] is a 62 y.o. female with medical history significant of HTN, T2DM, HLD, and tobacco abuse who presented to ED with complaints of right sided chest pain that radiates down right arm x 1 week. She states pain got worse and was unbearable and it prompted her to come to ED. Pain on lateral side of right anterior chest wall. She has no pain with arm movement and pain not reproducible.  States pain was constant and dull/achy. Nothing makes it better or worse except pain medication in the hospital. She has no associated symptoms including shortness of breath, palpitations, diaphoresis or substernal chest pain. She has been feeling well.     Family history of CAD in her father. Multiple risk factors.     Denies any fever/chills, vision changes/headaches, chest pain or palpitations, shortness of breath or cough, abdominal pain, N/V/D, dysuria or leg swelling.      She does smoke 1/2 PPD. No alcohol.   Assessment and Plan: * Chest pain with elevated troponin 62 year old female presenting with one week history of right sided chest pain radiating down right arm.  Atypical presentation with significant risk factors -Troponin elevated in the 260-270 range; EKG has demonstrated lateral ST depressions without prior EKG for comparison.   -Medical management with heparin drip, metoprolol, statin and aspirin provided; case discussed with cardiology services who felt that the best treatment/benefit for the patient a cardiac cath will be pursued. -Patient will be kept n.p.o. and patient transferred to [facility] for cardiac cath. -2D echo has demonstrated ejection fraction 55 to 60%, no wall motion abnormalities or significant valvular disorder. -Continue to follow any  further recommendations by cardiology service.  Leukocytosis -No fever, no acute source of infection appreciated -Most likely stress demargination -Repeat white blood cells within normal limits today. -Continue following clinical evolution.  Hypokalemia -Repleted/stabilized -Continue to follow electrolytes trends.  Controlled type 2 diabetes mellitus without complication, without long-term current use of insulin (HCC) -Continue sliding scale insulin -Follow A1c results -Continue to follow CBGs fluctuation and further adjust management as required.  Essential hypertension -Stable vital signs currently -Continue treatment with metoprolol -Heart healthy/low-sodium diet discussed with patient. -Follow blood pressure fluctuation.  Hyperlipidemia -Patient's Zocor has been exchanged for Lipitor -Heart healthy diet discussed with patient. -Repeat lipid panel and LFTs in 8 weeks.  Tobacco abuse -Cessation counseling provided -Continue the use of nicotine patch.   Subjective:  Patient expressing some chest tightness; no frank pain, no shortness of breath, no nausea, no vomiting, no fever.  In no acute distress currently  Physical Exam: Vitals:   11/04/22 0500 11/04/22 0600 11/04/22 0700 11/04/22 0800  BP: (!) 124/43 (!) 149/55 (!) 141/56 (!) 144/51  Pulse: 64 71 73   Resp: 18 16 13 17   Temp:   97.9 F (36.6 C)   TempSrc:   Oral   SpO2: 91% 92% 92%   Weight:      Height:       General exam: Alert, awake, oriented x 3; reporting no shortness of breath, good saturation on room air and is still feeling chills mild chest tightness, no flank pain. Respiratory system: Clear to auscultation. Respiratory effort normal.  No using accessory muscles. Cardiovascular system:RRR. No rubs  or gallops; no JVD. Gastrointestinal system: Abdomen is nondistended, soft and nontender. No organomegaly or masses felt. Normal bowel sounds heard. Central nervous system: Alert and oriented. No focal  neurological deficits. Extremities: No cyanosis or clubbing. Skin: No petechiae. Psychiatry: Judgement and insight appear normal. Mood & affect appropriate.   Data Reviewed: Troponin: 183>> 212>> 239>> 244>> 274 C-reactive protein:1.1 ESR:18 CBC: WBCs 10.2, hemoglobin 12.0 and platelets 293 K Basic metabolic panel: Sodium 139, potassium 4.3, chloride 107, bicarb 22, BUN 13, creatinine 0.56 and GFR > 60 Lipid panel: Total cholesterol 178, triglycerides 142, HDL 44 and LDL 106   Family Communication: No family at bedside  Disposition: Status is: Observation The patient remains OBS appropriate and will d/c before 2 midnights.   Planned Discharge Destination: Home  Time spent: 50 minutes  Author: Vassie Loll, MD 11/04/2022 10:10 AM  For on call review [email or web address].

## 2022-11-04 NOTE — H&P (View-Only) (Signed)
Rounding Note    Patient Name: [name] Date of Encounter: 11/04/2022  [facility] Cardiologist: Howell Rucks  Subjective   No complaints  Inpatient Medications    Scheduled Meds:  aspirin EC  81 mg Oral Daily   Chlorhexidine Gluconate Cloth  6 each Topical Q0600   heparin  2,000 Units Intravenous Once   hydrOXYzine  25 mg Oral QHS   insulin aspart  0-15 Units Subcutaneous TID WC   loratadine  10 mg Oral BID   metoprolol succinate  12.5 mg Oral Daily   nicotine  14 mg Transdermal Once   pneumococcal 20-valent conjugate vaccine  0.5 mL Intramuscular Tomorrow-1000   simvastatin  20 mg Oral QHS   Continuous Infusions:  heparin 1,050 Units/hr (11/04/22 0700)   PRN Meds: acetaminophen **OR** acetaminophen, HYDROcodone-acetaminophen, nitroGLYCERIN, ondansetron (ZOFRAN) IV, mouth rinse, traZODone   Vital Signs    Vitals:   11/04/22 0500 11/04/22 0600 11/04/22 0700 11/04/22 0800  BP: (!) 124/43 (!) 149/55 (!) 141/56 (!) 144/51  Pulse: 64 71 73   Resp: 18 16 13 17   Temp:   97.9 F (36.6 C)   TempSrc:   Oral   SpO2: 91% 92% 92%   Weight:      Height:        Intake/Output Summary (Last 24 hours) at 11/04/2022 0942 Last data filed at 11/04/2022 0700 Gross per 24 hour  Intake 697.29 ml  Output --  Net 697.29 ml      11/04/2022    4:42 AM 11/03/2022    1:33 PM 10/05/2022    1:39 PM  Last 3 Weights  Weight (lbs) 160 lb 15 oz 158 lb 166 lb 6 oz  Weight (kg) 73 kg 71.668 kg 75.467 kg      Telemetry    NSR - Personally Reviewed  ECG    N/a - Personally Reviewed  Physical Exam   GEN: No acute distress.   Neck: No JVD Cardiac: RRR, no murmurs, rubs, or gallops.  Respiratory: Clear to auscultation bilaterally. GI: Soft, nontender, non-distended  MS: No edema; No deformity. Neuro:  Nonfocal  Psych: Normal affect   Labs    High Sensitivity Troponin:   Recent Labs  Lab 11/03/22 1356 11/03/22 1537 11/03/22 1733 11/03/22 1857 11/04/22 0436   TROPONINIHS 183* 212* 239* 244* 274*     Chemistry Recent Labs  Lab 11/03/22 1356 11/03/22 1537 11/03/22 1733 11/04/22 0430  NA 137  --   --  139  K 3.2*  --   --  4.3  CL 103  --   --  107  CO2 24  --   --  22  GLUCOSE 121*  --   --  118*  BUN 15  --   --  13  CREATININE 0.49  --   --  0.56  CALCIUM 9.2  --   --  9.0  MG  --  1.7  --   --   PROT  --   --  7.3  --   ALBUMIN  --   --  4.1  --   AST  --   --  18  --   ALT  --   --  14  --   ALKPHOS  --   --  56  --   BILITOT  --   --  0.6  --   GFRNONAA >60  --   --  >60  ANIONGAP 10  --   --  10    Lipids  Recent Labs  Lab 11/04/22 0430  CHOL 178  TRIG 142  HDL 44  LDLCALC 106*  CHOLHDL 4.0    Hematology Recent Labs  Lab 11/03/22 1356 11/04/22 0430  WBC 11.7* 10.2  RBC 4.40 4.06  HGB 13.2 12.0  HCT 38.9 36.2  MCV 88.4 89.2  MCH 30.0 29.6  MCHC 33.9 33.1  RDW 13.5 13.7  PLT 303 293   Thyroid  Recent Labs  Lab 11/03/22 1733  TSH 0.978    BNPNo results for input(s): "BNP", "PROBNP" in the last 168 hours.  DDimer  Recent Labs  Lab 11/03/22 1356  DDIMER 0.43     Radiology    ECHOCARDIOGRAM COMPLETE  Result Date: 11/04/2022    ECHOCARDIOGRAM REPORT   Patient Name:   [name] Date of Exam: 11/04/2022 Medical Rec #:  [id number]    Height:       67.0 in Accession #:    [id number]   Weight:       160.9 lb Date of Birth:  1960-10-17   BSA:          1.844 m Patient Age:    61 years     BP:           144/51 mmHg Patient Gender: F            HR:           67 bpm. [facility] Procedure: 2D Echo, 3D Echo, Color Doppler and Cardiac Doppler Indications:    Chest Pain R07.9  History:        Patient has no prior history of Echocardiogram examinations.                 Signs/Symptoms:Chest Pain; Risk Factors:Diabetes, Hypertension,                 Dyslipidemia and Current Smoker.  Sonographer:    Aron Baba Referring Phys: [id number] Dorothe Pea Lyrical Sowle  Sonographer Comments: Image acquisition  challenging due to respiratory motion. IMPRESSIONS  1. Left ventricular ejection fraction, by estimation, is 55 to 60%. The left ventricle has normal function. The left ventricle has no regional wall motion abnormalities. There is mild left ventricular hypertrophy. Left ventricular diastolic parameters were normal.  2. Right ventricular systolic function is normal. The right ventricular size is normal. Tricuspid regurgitation signal is inadequate for assessing PA pressure.  3. The mitral valve is normal in structure. Trivial mitral valve regurgitation. No evidence of mitral stenosis.  4. The aortic valve has an indeterminant number of cusps. Aortic valve regurgitation is not visualized. No aortic stenosis is present.  5. The inferior vena cava is normal in size with greater than 50% respiratory variability, suggesting right atrial pressure of 3 mmHg. FINDINGS  Left Ventricle: Left ventricular ejection fraction, by estimation, is 55 to 60%. The left ventricle has normal function. The left ventricle has no regional wall motion abnormalities. The left ventricular internal cavity size was normal in size. There is  mild left ventricular hypertrophy. Left ventricular diastolic parameters were normal. Right Ventricle: The right ventricular size is normal. Right vetricular wall thickness was not well visualized. Right ventricular systolic function is normal. Tricuspid regurgitation signal is inadequate for assessing PA pressure. Left Atrium: Left atrial size was normal in size. Right Atrium: Right atrial size was normal in size. Pericardium: There is no evidence of pericardial effusion. Mitral Valve: The mitral valve is normal in structure. Trivial mitral  valve regurgitation. No evidence of mitral valve stenosis. Tricuspid Valve: The tricuspid valve is normal in structure. Tricuspid valve regurgitation is trivial. No evidence of tricuspid stenosis. Aortic Valve: The aortic valve has an indeterminant number of cusps. Aortic  valve regurgitation is not visualized. No aortic stenosis is present. Aortic valve mean gradient measures 4.0 mmHg. Aortic valve peak gradient measures 7.0 mmHg. Aortic valve area, by VTI measures 2.77 cm. Pulmonic Valve: The pulmonic valve was not well visualized. Pulmonic valve regurgitation is not visualized. No evidence of pulmonic stenosis. Aorta: The aortic root is normal in size and structure. Venous: The inferior vena cava is normal in size with greater than 50% respiratory variability, suggesting right atrial pressure of 3 mmHg. IAS/Shunts: No atrial level shunt detected by color flow Doppler.  LEFT VENTRICLE PLAX 2D LVIDd:         4.70 cm   Diastology LVIDs:         3.30 cm   LV e' medial:    7.61 cm/s LV PW:         1.10 cm   LV E/e' medial:  11.6 LV IVS:        1.10 cm   LV e' lateral:   9.77 cm/s LVOT diam:     2.20 cm   LV E/e' lateral: 9.0 LV SV:         88 LV SV Index:   48 LVOT Area:     3.80 cm                           3D Volume EF:                          3D EF:        57 %                          LV EDV:       122 ml                          LV ESV:       53 ml                          LV SV:        69 ml RIGHT VENTRICLE RV S prime:     14.70 cm/s TAPSE (M-mode): 2.4 cm LEFT ATRIUM           Index        RIGHT ATRIUM           Index LA diam:      3.20 cm 1.74 cm/m   RA Area:     16.40 cm LA Vol (A2C): 56.2 ml 30.48 ml/m  RA Volume:   39.20 ml  21.26 ml/m LA Vol (A4C): 27.7 ml 15.02 ml/m  AORTIC VALVE AV Area (Vmax):    2.84 cm AV Area (Vmean):   2.50 cm AV Area (VTI):     2.77 cm AV Vmax:           132.48 cm/s AV Vmean:          92.213 cm/s AV VTI:            0.318 m AV Peak Grad:      7.0 mmHg AV Mean Grad:  4.0 mmHg LVOT Vmax:         99.00 cm/s LVOT Vmean:        60.600 cm/s LVOT VTI:          0.232 m LVOT/AV VTI ratio: 0.73  AORTA Ao Root diam: 3.40 cm Ao Asc diam:  3.00 cm MITRAL VALVE MV Area (PHT): 4.06 cm    SHUNTS MV Decel Time: 187 msec    Systemic VTI:  0.23 m MR Peak  grad: 16.4 mmHg    Systemic Diam: 2.20 cm MR Vmax:      202.50 cm/s MV E velocity: 88.40 cm/s MV A velocity: 75.80 cm/s MV E/A ratio:  1.17 Dina Rich MD Electronically signed by Dina Rich MD Signature Date/Time: 11/04/2022/9:41:45 AM    Final    DG Chest Portable 1 View  Result Date: 11/03/2022 CLINICAL DATA:  Right-sided chest pain EXAM: PORTABLE CHEST 1 VIEW COMPARISON:  03/10/2010 FINDINGS: Heart size is normal. Mediastinal shadows are normal. There are old healed rib fractures on the right. The right lung is clear. There is mild linear scarring on the left adjacent to the heart border. No suspicion of active pneumonia, collapse, edema or effusion. IMPRESSION: No active disease. Old healed rib fractures on the right. Mild linear scarring on the left. Electronically Signed   By: Paulina Fusi M.D.   On: 11/03/2022 14:00    Cardiac Studies    Patient Profile     [name] is a 62 y.o. female with a hx of HTN, tobacco abuse, HLD, DM2 who is being seen 11/03/2022 for the evaluation of chest pain at the request of Dr Ernest Mallick.   Assessment & Plan    1.NSTEMI - very atypical symptoms. Right upper chest down right arm, lasting hours at a time with a positional component, better with prn tylenol and ibuprofen - ddimer negative - trop is elevated, 274 without clear peak. EKG lateral ST depressions without prior EKG for comparison - echo LVEF 55-60%, no WMAs  -despite very atypical symptoms she has multiple CAD risk factors including DM2, HTN, HLD, tobacco history. She has elevated trop and abnormal EKG. Certaintly in a female diabetic patient at risk for atypical angina. Will plan for cath today  - medical therapy with ASA 81, hep gtt, toprol 12.5. Change her simva to atorva 40mg . ACEi allergy, if CAD confirmed could add ARB  Will ask hospitalist to arrange transfer to med team, we will arrange cath.        For questions or updates, please contact [facility] Please  consult [email or web address] for contact info under        Signed, Dina Rich, MD  11/04/2022, 9:42 AM

## 2022-11-04 NOTE — Progress Notes (Signed)
TR BAND REMOVAL  LOCATION:     radial  DEFLATED PER PROTOCOL:    Yes.    TIME BAND OFF / DRESSING APPLIED:    1900p  a clean dry dressing applied with gauze and tegaderm   SITE UPON ARRIVAL:    Level 0  SITE AFTER BAND REMOVAL:    Level 0  CIRCULATION SENSATION AND MOVEMENT:    Within Normal Limits   Yes.    COMMENTS:   Care instructions given to patient.

## 2022-11-05 ENCOUNTER — Telehealth: Payer: Self-pay | Admitting: Cardiology

## 2022-11-05 ENCOUNTER — Other Ambulatory Visit (HOSPITAL_COMMUNITY): Payer: Self-pay

## 2022-11-05 ENCOUNTER — Encounter (HOSPITAL_COMMUNITY): Payer: Self-pay | Admitting: Cardiovascular Disease

## 2022-11-05 DIAGNOSIS — I10 Essential (primary) hypertension: Secondary | ICD-10-CM | POA: Diagnosis not present

## 2022-11-05 DIAGNOSIS — I214 Non-ST elevation (NSTEMI) myocardial infarction: Secondary | ICD-10-CM | POA: Diagnosis present

## 2022-11-05 DIAGNOSIS — R079 Chest pain, unspecified: Secondary | ICD-10-CM | POA: Diagnosis not present

## 2022-11-05 DIAGNOSIS — E119 Type 2 diabetes mellitus without complications: Secondary | ICD-10-CM | POA: Diagnosis not present

## 2022-11-05 LAB — BASIC METABOLIC PANEL
Anion gap: 10 (ref 5–15)
BUN: 10 mg/dL (ref 6–20)
CO2: 23 mmol/L (ref 22–32)
Calcium: 9.1 mg/dL (ref 8.9–10.3)
Chloride: 106 mmol/L (ref 98–111)
Creatinine, Ser: 0.57 mg/dL (ref 0.44–1.00)
GFR, Estimated: >60 mL/min (ref >60)
Glucose, Bld: 91 mg/dL (ref 70–99)
Potassium: 3.5 mmol/L (ref 3.5–5.1)
Sodium: 139 mmol/L (ref 135–145)

## 2022-11-05 LAB — GLUCOSE, CAPILLARY
Glucose-Capillary: 116 mg/dL — ABNORMAL HIGH (ref 70–99)
Glucose-Capillary: 70 mg/dL (ref 70–99)

## 2022-11-05 LAB — MAGNESIUM: Magnesium: 1.8 mg/dL (ref 1.7–2.4)

## 2022-11-05 MED ORDER — METOPROLOL SUCCINATE ER 25 MG PO TB24
12.5000 mg | ORAL_TABLET | Freq: Every day | ORAL | 0 refills | Status: AC
  Filled 2022-11-05: qty 45, 90d supply, fill #0

## 2022-11-05 MED ORDER — ASPIRIN 81 MG PO TBEC
81.0000 mg | DELAYED_RELEASE_TABLET | Freq: Every day | ORAL | 0 refills | Status: AC
  Filled 2022-11-05: qty 120, 120d supply, fill #0

## 2022-11-05 MED ORDER — CLOPIDOGREL BISULFATE 75 MG PO TABS
75.0000 mg | ORAL_TABLET | Freq: Every day | ORAL | 0 refills | Status: DC
  Filled 2022-11-05: qty 90, 90d supply, fill #0

## 2022-11-05 MED ORDER — ATORVASTATIN CALCIUM 40 MG PO TABS
40.0000 mg | ORAL_TABLET | Freq: Every evening | ORAL | 0 refills | Status: DC
  Filled 2022-11-05: qty 90, 90d supply, fill #0

## 2022-11-05 MED ORDER — CLOPIDOGREL BISULFATE 75 MG PO TABS: 75.0000 mg | ORAL_TABLET | Freq: Every day | ORAL | Status: DC

## 2022-11-05 MED ORDER — NITROGLYCERIN 0.4 MG SL SUBL
0.4000 mg | SUBLINGUAL_TABLET | SUBLINGUAL | 0 refills | Status: AC | PRN
  Filled 2022-11-05: qty 25, 7d supply, fill #0

## 2022-11-05 MED ORDER — POTASSIUM CHLORIDE CRYS ER 10 MEQ PO TBCR
40.0000 meq | EXTENDED_RELEASE_TABLET | Freq: Once | ORAL | Status: AC
  Administered 2022-11-05: 40 meq via ORAL
  Filled 2022-11-05: qty 4

## 2022-11-05 MED ORDER — CLOPIDOGREL BISULFATE 75 MG PO TABS
600.0000 mg | ORAL_TABLET | Freq: Once | ORAL | Status: AC
  Administered 2022-11-05: 600 mg via ORAL
  Filled 2022-11-05: qty 8

## 2022-11-05 NOTE — Progress Notes (Signed)
Written discharge paperwork reviewed with patient.  Patient states understanding of medication changes and appointment follow ups.  She states she will make her own PCP follow up.

## 2022-11-05 NOTE — Progress Notes (Signed)
CARDIAC REHAB PHASE I   PRE:  Rate/Rhythm: 62 NSR  BP:  Sitting: 137/68      SaO2: 97 RA  MODE:  Ambulation: 370 ft   AD:   no AD  POST:  Rate/Rhythm: 78 SR  BP:  Sitting: 145/65      SaO2: 97 RA  Pt amb with supervision assistance, pt denies CP and SOB during amb and was returned to room w/o complaint.   Pt was educated on stent card, stent location, Antiplatelet and ASA use, wt restrictions, no baths/daily wash-ups, s/s of infection, ex guidelines, s/s to stop exercising, NTG use and calling 911, heart healthy diet and diabetic diet, risk factors and CRPII. Pt received MI book and materials on exercise, diet, and CRPII. Will refer to AP.   Pt reports exercising 5d/wk prior to onset of symptoms. Pt would like to return to gym but I advised her to wait until CR before exercising in gym. I also discussed smoking cessation with pt, pt reports she hasn't craved a cigarette since being admitted.    Faustino Congress  ACSM-CEP 11:00 AM 11/05/2022    Service time is from 1030 to 1100.

## 2022-11-05 NOTE — Progress Notes (Signed)
Rounding Note    Patient Name: [name] Date of Encounter: 11/05/2022  [facility] Cardiologist: None   Subjective   Doing okay but having some problems with her breathing.  Feels like she cannot take of breath.  No chest pain.  Otherwise feels well.  Inpatient Medications    Scheduled Meds:  aspirin EC  81 mg Oral Daily   atorvastatin  40 mg Oral QPM   Chlorhexidine Gluconate Cloth  6 each Topical Q0600   hydrOXYzine  25 mg Oral QHS   insulin aspart  0-15 Units Subcutaneous TID WC   loratadine  10 mg Oral BID   metoprolol succinate  12.5 mg Oral Daily   sodium chloride flush  3 mL Intravenous Q12H   ticagrelor  90 mg Oral BID   Continuous Infusions:  sodium chloride     PRN Meds: sodium chloride, acetaminophen **OR** acetaminophen, HYDROcodone-acetaminophen, nitroGLYCERIN, ondansetron (ZOFRAN) IV, mouth rinse, sodium chloride flush, traZODone   Vital Signs    Vitals:   11/04/22 2345 11/05/22 0403 11/05/22 0418 11/05/22 0715  BP: (!) 131/57 138/64 138/64 137/68  Pulse: 63 74 71 72  Resp: 18 20 20 18   Temp: 97.6 F (36.4 C) 98.1 F (36.7 C) 98.1 F (36.7 C) 98.2 F (36.8 C)  TempSrc: Oral Oral Oral Oral  SpO2: 94% 96% 96% 96%  Weight:      Height:        Intake/Output Summary (Last 24 hours) at 11/05/2022 0802 Last data filed at 11/04/2022 2000 Gross per 24 hour  Intake 316.95 ml  Output --  Net 316.95 ml      11/04/2022    7:38 PM 11/04/2022    4:42 AM 11/03/2022    1:33 PM  Last 3 Weights  Weight (lbs) 160 lb 11.5 oz 160 lb 15 oz 158 lb  Weight (kg) 72.9 kg 73 kg 71.668 kg       ECG    Normal sinus rhythm 65 bpm, ST and T wave abnormality consider inferior ischemia- Personally Reviewed  Physical Exam  Alert, oriented, no distress GEN: No acute distress.   Neck: No JVD Cardiac: RRR, no murmurs, rubs, or gallops.  Respiratory: Clear to auscultation bilaterally. GI: Soft, nontender, non-distended  MS: No edema; No deformity.   Right radial cath site is clear. Neuro:  Nonfocal  Psych: Normal affect   Labs    High Sensitivity Troponin:   Recent Labs  Lab 11/03/22 1356 11/03/22 1537 11/03/22 1733 11/03/22 1857 11/04/22 0436  TROPONINIHS 183* 212* 239* 244* 274*     Chemistry Recent Labs  Lab 11/03/22 1356 11/03/22 1537 11/03/22 1733 11/04/22 0430 11/05/22 0254  NA 137  --   --  139 139  K 3.2*  --   --  4.3 3.5  CL 103  --   --  107 106  CO2 24  --   --  22 23  GLUCOSE 121*  --   --  118* 91  BUN 15  --   --  13 10  CREATININE 0.49  --   --  0.56 0.57  CALCIUM 9.2  --   --  9.0 9.1  MG  --  1.7  --   --   --   PROT  --   --  7.3  --   --   ALBUMIN  --   --  4.1  --   --   AST  --   --  18  --   --  ALT  --   --  14  --   --   ALKPHOS  --   --  56  --   --   BILITOT  --   --  0.6  --   --   GFRNONAA >60  --   --  >60 >60  ANIONGAP 10  --   --  10 10    Lipids  Recent Labs  Lab 11/04/22 0430  CHOL 178  TRIG 142  HDL 44  LDLCALC 106*  CHOLHDL 4.0    Hematology Recent Labs  Lab 11/03/22 1356 11/04/22 0430 11/05/22 0254  WBC 11.7* 10.2 8.7  RBC 4.40 4.06 4.05  HGB 13.2 12.0 11.7*  HCT 38.9 36.2 35.5*  MCV 88.4 89.2 87.7  MCH 30.0 29.6 28.9  MCHC 33.9 33.1 33.0  RDW 13.5 13.7 13.6  PLT 303 293 277   Thyroid  Recent Labs  Lab 11/03/22 1733  TSH 0.978    BNPNo results for input(s): "BNP", "PROBNP" in the last 168 hours.  DDimer  Recent Labs  Lab 11/03/22 1356  DDIMER 0.43     Radiology    CARDIAC CATHETERIZATION  Result Date: 11/04/2022   Dist RCA lesion is 99% stenosed.   Mid Cx lesion is 30% stenosed.   Mid LAD lesion is 20% stenosed.   A drug-eluting stent was successfully placed using a SYNERGY XD 3.0X24.   Post intervention, there is a 0% residual stenosis. Severe distal RCA stenosis Successful PTCA/DES x 1 distal RCA Mild non-obstructive disease in the Circumflex and LAD Recommendations: DAPT with ASA and Brilinta for one year. Continue statin.    ECHOCARDIOGRAM COMPLETE  Result Date: 11/04/2022    ECHOCARDIOGRAM REPORT   Patient Name:   [name] Date of Exam: 11/04/2022 Medical Rec #:  [id number]    Height:       67.0 in Accession #:    [id number]   Weight:       160.9 lb Date of Birth:  [birth year]   BSA:          1.844 m Patient Age:    61 years     BP:           144/51 mmHg Patient Gender: F            HR:           67 bpm. [facility] Procedure: 2D Echo, 3D Echo, Color Doppler and Cardiac Doppler Indications:    Chest Pain R07.9  History:        Patient has no prior history of Echocardiogram examinations.                 Signs/Symptoms:Chest Pain; Risk Factors:Diabetes, Hypertension,                 Dyslipidemia and Current Smoker.  Sonographer:    Aron Baba Referring Phys: [id number] Dorothe Pea BRANCH  Sonographer Comments: Image acquisition challenging due to respiratory motion. IMPRESSIONS  1. Left ventricular ejection fraction, by estimation, is 55 to 60%. The left ventricle has normal function. The left ventricle has no regional wall motion abnormalities. There is mild left ventricular hypertrophy. Left ventricular diastolic parameters were normal.  2. Right ventricular systolic function is normal. The right ventricular size is normal. Tricuspid regurgitation signal is inadequate for assessing PA pressure.  3. The mitral valve is normal in structure. Trivial mitral valve regurgitation. No evidence of mitral stenosis.  4. The aortic valve  has an indeterminant number of cusps. Aortic valve regurgitation is not visualized. No aortic stenosis is present.  5. The inferior vena cava is normal in size with greater than 50% respiratory variability, suggesting right atrial pressure of 3 mmHg. FINDINGS  Left Ventricle: Left ventricular ejection fraction, by estimation, is 55 to 60%. The left ventricle has normal function. The left ventricle has no regional wall motion abnormalities. The left ventricular internal cavity size was normal  in size. There is  mild left ventricular hypertrophy. Left ventricular diastolic parameters were normal. Right Ventricle: The right ventricular size is normal. Right vetricular wall thickness was not well visualized. Right ventricular systolic function is normal. Tricuspid regurgitation signal is inadequate for assessing PA pressure. Left Atrium: Left atrial size was normal in size. Right Atrium: Right atrial size was normal in size. Pericardium: There is no evidence of pericardial effusion. Mitral Valve: The mitral valve is normal in structure. Trivial mitral valve regurgitation. No evidence of mitral valve stenosis. Tricuspid Valve: The tricuspid valve is normal in structure. Tricuspid valve regurgitation is trivial. No evidence of tricuspid stenosis. Aortic Valve: The aortic valve has an indeterminant number of cusps. Aortic valve regurgitation is not visualized. No aortic stenosis is present. Aortic valve mean gradient measures 4.0 mmHg. Aortic valve peak gradient measures 7.0 mmHg. Aortic valve area, by VTI measures 2.77 cm. Pulmonic Valve: The pulmonic valve was not well visualized. Pulmonic valve regurgitation is not visualized. No evidence of pulmonic stenosis. Aorta: The aortic root is normal in size and structure. Venous: The inferior vena cava is normal in size with greater than 50% respiratory variability, suggesting right atrial pressure of 3 mmHg. IAS/Shunts: No atrial level shunt detected by color flow Doppler.  LEFT VENTRICLE PLAX 2D LVIDd:         4.70 cm   Diastology LVIDs:         3.30 cm   LV e' medial:    7.61 cm/s LV PW:         1.10 cm   LV E/e' medial:  11.6 LV IVS:        1.10 cm   LV e' lateral:   9.77 cm/s LVOT diam:     2.20 cm   LV E/e' lateral: 9.0 LV SV:         88 LV SV Index:   48 LVOT Area:     3.80 cm                           3D Volume EF:                          3D EF:        57 %                          LV EDV:       122 ml                          LV ESV:       53 ml                           LV SV:        69 ml RIGHT VENTRICLE RV S prime:     14.70 cm/s TAPSE (M-mode): 2.4 cm LEFT  ATRIUM           Index        RIGHT ATRIUM           Index LA diam:      3.20 cm 1.74 cm/m   RA Area:     16.40 cm LA Vol (A2C): 56.2 ml 30.48 ml/m  RA Volume:   39.20 ml  21.26 ml/m LA Vol (A4C): 27.7 ml 15.02 ml/m  AORTIC VALVE AV Area (Vmax):    2.84 cm AV Area (Vmean):   2.50 cm AV Area (VTI):     2.77 cm AV Vmax:           132.48 cm/s AV Vmean:          92.213 cm/s AV VTI:            0.318 m AV Peak Grad:      7.0 mmHg AV Mean Grad:      4.0 mmHg LVOT Vmax:         99.00 cm/s LVOT Vmean:        60.600 cm/s LVOT VTI:          0.232 m LVOT/AV VTI ratio: 0.73  AORTA Ao Root diam: 3.40 cm Ao Asc diam:  3.00 cm MITRAL VALVE MV Area (PHT): 4.06 cm    SHUNTS MV Decel Time: 187 msec    Systemic VTI:  0.23 m MR Peak grad: 16.4 mmHg    Systemic Diam: 2.20 cm MR Vmax:      202.50 cm/s MV E velocity: 88.40 cm/s MV A velocity: 75.80 cm/s MV E/A ratio:  1.17 Dina Rich MD Electronically signed by Dina Rich MD Signature Date/Time: 11/04/2022/9:41:45 AM    Final    DG Chest Portable 1 View  Result Date: 11/03/2022 CLINICAL DATA:  Right-sided chest pain EXAM: PORTABLE CHEST 1 VIEW COMPARISON:  03/10/2010 FINDINGS: Heart size is normal. Mediastinal shadows are normal. There are old healed rib fractures on the right. The right lung is clear. There is mild linear scarring on the left adjacent to the heart border. No suspicion of active pneumonia, collapse, edema or effusion. IMPRESSION: No active disease. Old healed rib fractures on the right. Mild linear scarring on the left. Electronically Signed   By: Paulina Fusi M.D.   On: 11/03/2022 14:00    Cardiac Studies   Cardiac catheterization study results reviewed  Patient Profile     62 y.o. female with non-STEMI  Assessment & Plan    1.  Non-STEMI: Atypical symptoms but underlying risk factors of type 2 diabetes, hypertension,  hyperlipidemia, and history of tobacco.  Cardiac catheterization demonstrated critical stenosis of the distal RCA, treated with PCI using a drug-eluting stent.  Procedure was uncomplicated.  Patient with nonobstructive plaquing in the other vessels.  Now on aspirin, high intensity statin drug with atorvastatin 40 mg, metoprolol, and ticagrelor.  She should continue on DAPT at least 12 months.  I do not think she is going to tolerate Brilinta as she has had significant shortness of breath that is very typical for Brilinta related shortness of breath.  I will switch her to clopidogrel today with a 600 mg loading dose this morning then she should start 75 mg daily tomorrow. 2.  Hypertension: Blood pressure well-controlled.  Currently just on a low-dose beta-blocker.  Patient allergic to ACE inhibitors.  Can consider ARB as outpatient. 3.  Mixed hyperlipidemia: LDL cholesterol 106.  Started on atorvastatin 40 mg daily.  Disposition: Patient stable  for discharge today from a cardiac perspective.  See above for plans with her P2 Y12 inhibitor.  Will arrange cardiology follow-up with [facility].  For questions or updates, please contact [facility] Please consult [email or web address] for contact info under        Signed, Tonny Bollman, MD  11/05/2022, 8:02 AM

## 2022-11-05 NOTE — Discharge Summary (Signed)
Physician Discharge Summary  [name] JXB:[id number] DOB: 1960-09-03 DOA: 11/03/2022  PCP: Kizzie Furnish D., PA-C  Admit date: 11/03/2022 Discharge date: 11/05/2022  Time spent: 60 minutes  Recommendations for Outpatient Follow-up:  Follow-up with Kizzie Furnish D., PA-C in 3 weeks.  On follow-up patient will need a basic metabolic profile done to follow-up on electrolytes and renal function.  Patient's blood pressure need to be reassessed. Follow-up with Jacolyn Reedy, PA, cardiology on 11/16/2022 at 1 PM.   Discharge Diagnoses:  Principal Problem:   Non-ST elevation (NSTEMI) myocardial infarction [facility]) Active Problems:   Chest pain with elevated troponin   Leukocytosis   Hypokalemia   Controlled type 2 diabetes mellitus without complication, without long-term current use of insulin (HCC)   Essential hypertension   Hyperlipidemia   Tobacco abuse   Elevated troponin   Discharge Condition: Stable and improved  Diet recommendation: Heart healthy  Filed Weights   11/03/22 1333 11/04/22 0442 11/04/22 1938  Weight: 71.7 kg 73 kg 72.9 kg    History of present illness:  HPI per Dr. Bonnee Quin is a 62 y.o. female with medical history significant of HTN, T2DM, HLD, and tobacco abuse who presented to ED with complaints of right sided chest pain that radiates down right arm x 1 week. She states pain got worse and was unbearable and it prompted her to come to ED. Pain on lateral side of right anterior chest wall. She has no pain with arm movement and pain not reproducible.  States pain was constant and dull/achy. Nothing makes it better or worse except pain medication in the hospital. She has no associated symptoms including shortness of breath, palpitations, diaphoresis or substernal chest pain. She has been feeling well.     Family history of CAD in her father. Multiple risk factors.     Denies any fever/chills, vision changes/headaches, chest pain or palpitations, shortness  of breath or cough, abdominal pain, N/V/D, dysuria or leg swelling.      She does smoke 1/2 PPD. No alcohol.    ER Course:  vitals: afebrile, bp: 164/76, HR: 93, RR: 20, oxygen: 97%RA Pertinent labs: wbc: 11.7, potassium: 3.2, troponin 183>212,  CXR: no active disease In ED: cardiology consulted. Atypical CP, but has risks. Recommended heparin gtt, trend troponin, echo. NPO at midnight. TRH asked to admit.   Hospital Course:  #1 non-STEMI/chest pain with elevated troponin with atypical features -Patient had presented to the ED with complaints of right chest pain with radiation to the right upper extremity ongoing x 1 week which had worsened causing patient to present to the ED.  Chest pain was not reproducible and no pain with arm movement.  Patient denied any shortness of breath, palpitations, diaphoresis. -Patient noted with multiple risk factors of hypertension, diabetes, hyperlipidemia, ongoing tobacco use, family history of CAD. -EKG done on admission with lateral ST depression without prior EKG for comparison. -Patient noted to have elevated troponins in the 2 62-70 range. -Patient placed on IV heparin, metoprolol, statin, aspirin and cardiology consulted. -2D echo which was done showed a EF of 55 to 60%,NWMA, no significant valvular abnormalities were noted. -Patient seen in consultation by cardiology, who felt despite patient's atypical symptoms she had multiple cardiac risk factors with elevated troponin, abnormal EKG and recommended further evaluation with cardiac catheterization and ongoing medical therapy. -Patient simvastatin was changed to atorvastatin, patient maintained on Toprol XL 12.5 daily as well as aspirin.  Patient noted to have ACE inhibitor allergy  and as such was not placed on the ACE inhibitor. -Patient subsequently underwent cardiac catheterization 11/04/2022 which demonstrated critical stenosis of the distal RCA, treated with PCI using a drug-eluting stent with  clinical improvement. -Cardiology recommended dual antiplatelet therapy for at least 12 months.  Patient initially started on Brilinta however noted to develop some shortness of breath and per cardiology very typical of Brilinta related shortness of breath and as such Brilinta was discontinued and patient started on clopidogrel.  Patient given a loading dose of clopidogrel 600 mg on day of discharge and patient recommended to start on clopidogrel 75 mg daily starting 11/06/2022 in addition to atorvastatin, metoprolol, aspirin. -Tobacco cessation stressed to patient. -Patient improved clinically, was chest pain-free by day of discharge and cleared by cardiology for discharge with close outpatient follow-up. -Patient be discharged in stable and improved condition.  2.  Hypertension -Remained well-controlled during the hospitalization, patient maintained on low-dose beta-blocker and patient's HCTZ will be resumed on discharge.  3.  Mixed hyperlipidemia -Patient noted to have LDL cholesterol 106. -Patient simvastatin was changed to atorvastatin 40 mg daily which patient be discharged home on.  4.  Hypokalemia -Repleted during the hospitalization.  5.  Leukocytosis -Patient remained afebrile, no source of infection noted, felt likely stress demargination. -Leukocytosis had resolved by day of discharge.  6.  Well-controlled type 2 diabetes mellitus without complication, without long-term use of insulin -Hemoglobin A1c noted at 6.2 (11/03/2022) -Patient's oral hypoglycemic agents were held during the hospitalization. -Patient maintained on SSI during the hospitalization.  7.  Tobacco abuse -Tobacco cessation.  Procedures: Chest x-ray 11/03/2022 2D echo 11/04/2022 Cardiac catheterization 11/04/2022 per Dr. Clifton James  Consultations: Cardiology: Dr. Wyline Mood 11/03/2022  Discharge Exam: Vitals:   11/05/22 0715 11/05/22 1148  BP: 137/68 133/65  Pulse: 72   Resp: 18 (!) 21  Temp: 98.2 F (36.8 C)  98.6 F (37 C)  SpO2: 96% 100%    General: NAD Cardiovascular: RRR no murmurs rubs or gallops.  No JVD.  No lower extremity edema. Respiratory: Clear to auscultation bilaterally.  No wheezes, no crackles, no rhonchi.  Fair air movement.  Speaking in full sentences.  Discharge Instructions   Discharge Instructions     Amb Referral to Cardiac Rehabilitation   Complete by: As directed    Diagnosis:  Coronary Stents NSTEMI     After initial evaluation and assessments completed: Virtual Based Care may be provided alone or in conjunction with Phase 2 Cardiac Rehab based on patient barriers.: Yes   Intensive Cardiac Rehabilitation (ICR) [facility] location only OR Traditional Cardiac Rehabilitation (TCR) *If criteria for ICR are not met will enroll in TCR [facility] only): Yes   Diet - low sodium heart healthy   Complete by: As directed    Increase activity slowly   Complete by: As directed       Allergies as of 11/05/2022       Reactions   Prednisone Rash   Lisinopril Swelling        Medication List     STOP taking these medications    simvastatin 20 MG tablet Commonly known as: ZOCOR       TAKE these medications    ANTACID PO Take 1 tablet by mouth daily.   aspirin EC 81 MG tablet Take 1 tablet (81 mg total) by mouth daily. Swallow whole. Start taking on: November 06, 2022   atorvastatin 40 MG tablet Commonly known as: LIPITOR Take 1 tablet (40 mg total) by mouth every evening.  Benadryl Allergy 25 MG tablet Generic drug: diphenhydrAMINE Take 50 mg by mouth 2 (two) times daily.   cetirizine 10 MG tablet Commonly known as: ZYRTEC Take 10 mg by mouth daily.   clopidogrel 75 MG tablet Commonly known as: PLAVIX Take 1 tablet (75 mg total) by mouth daily. Start taking on: November 06, 2022   desonide 0.05 % cream Commonly known as: DesOwen Use twice daily for flare ups on face, maximum 10 days.   EPINEPHrine 0.3 mg/0.3 mL Soaj injection Commonly known as:  EPI-PEN Inject into the muscle.   hydrochlorothiazide 12.5 MG tablet Commonly known as: HYDRODIURIL Take 12.5 mg by mouth daily.   hydrOXYzine 25 MG tablet Commonly known as: ATARAX Take 25 mg by mouth 4 (four) times daily as needed.   KRILL OIL PO Take 2 tablets by mouth 2 (two) times daily.   loratadine 10 MG tablet Commonly known as: CLARITIN Take 10 mg by mouth daily.   metFORMIN 1000 MG tablet Commonly known as: GLUCOPHAGE Take 1,000 mg by mouth 2 (two) times daily.   metoprolol succinate 25 MG 24 hr tablet Commonly known as: TOPROL-XL Take 0.5 tablets (12.5 mg total) by mouth daily. Start taking on: November 06, 2022   MULTIVITAMIN ADULT PO Take 1 tablet by mouth.   nitroGLYCERIN 0.4 MG SL tablet Commonly known as: NITROSTAT Place 1 tablet (0.4 mg total) under the tongue every 5 (five) minutes x 3 doses as needed for chest pain.   OVER THE COUNTER MEDICATION Take 1 tablet by mouth daily. potassium   traZODone 100 MG tablet Commonly known as: DESYREL Take 50-100 mg by mouth at bedtime as needed.   triamcinolone ointment 0.1 % Commonly known as: KENALOG Apply twice daily for flare ups below neck, maximum 10 days.       Allergies  Allergen Reactions   Prednisone Rash   Lisinopril Swelling    Follow-up Information     Dyann Kief, PA-C Follow up on 11/16/2022.   Specialty: Cardiology Why: at 1pm for your cardiology follow up appt Contact information: [address] [place] [address] [phone]         Kizzie Furnish D., PA-C. Schedule an appointment as soon as possible for a visit in 3 week(s).   Contact information: [address] [place] [address] [phone]                  The results of significant diagnostics from this hospitalization (including imaging, microbiology, ancillary and laboratory) are listed below for reference.    Significant Diagnostic Studies: CARDIAC CATHETERIZATION  Result Date:  11/04/2022   Dist RCA lesion is 99% stenosed.   Mid Cx lesion is 30% stenosed.   Mid LAD lesion is 20% stenosed.   A drug-eluting stent was successfully placed using a SYNERGY XD 3.0X24.   Post intervention, there is a 0% residual stenosis. Severe distal RCA stenosis Successful PTCA/DES x 1 distal RCA Mild non-obstructive disease in the Circumflex and LAD Recommendations: DAPT with ASA and Brilinta for one year. Continue statin.   ECHOCARDIOGRAM COMPLETE  Result Date: 11/04/2022    ECHOCARDIOGRAM REPORT   Patient Name:   CHIMERA ALOE Date of Exam: 11/04/2022 Medical Rec #:  [id number]    Height:       67.0 in Accession #:    [id number]   Weight:       160.9 lb Date of Birth:  11-30-1960   BSA:  1.844 m Patient Age:    61 years     BP:           144/51 mmHg Patient Gender: F            HR:           67 bpm. [facility] Procedure: 2D Echo, 3D Echo, Color Doppler and Cardiac Doppler Indications:    Chest Pain R07.9  History:        Patient has no prior history of Echocardiogram examinations.                 Signs/Symptoms:Chest Pain; Risk Factors:Diabetes, Hypertension,                 Dyslipidemia and Current Smoker.  Sonographer:    Aron Baba Referring Phys: [id number] Dorothe Pea BRANCH  Sonographer Comments: Image acquisition challenging due to respiratory motion. IMPRESSIONS  1. Left ventricular ejection fraction, by estimation, is 55 to 60%. The left ventricle has normal function. The left ventricle has no regional wall motion abnormalities. There is mild left ventricular hypertrophy. Left ventricular diastolic parameters were normal.  2. Right ventricular systolic function is normal. The right ventricular size is normal. Tricuspid regurgitation signal is inadequate for assessing PA pressure.  3. The mitral valve is normal in structure. Trivial mitral valve regurgitation. No evidence of mitral stenosis.  4. The aortic valve has an indeterminant number of cusps. Aortic valve regurgitation  is not visualized. No aortic stenosis is present.  5. The inferior vena cava is normal in size with greater than 50% respiratory variability, suggesting right atrial pressure of 3 mmHg. FINDINGS  Left Ventricle: Left ventricular ejection fraction, by estimation, is 55 to 60%. The left ventricle has normal function. The left ventricle has no regional wall motion abnormalities. The left ventricular internal cavity size was normal in size. There is  mild left ventricular hypertrophy. Left ventricular diastolic parameters were normal. Right Ventricle: The right ventricular size is normal. Right vetricular wall thickness was not well visualized. Right ventricular systolic function is normal. Tricuspid regurgitation signal is inadequate for assessing PA pressure. Left Atrium: Left atrial size was normal in size. Right Atrium: Right atrial size was normal in size. Pericardium: There is no evidence of pericardial effusion. Mitral Valve: The mitral valve is normal in structure. Trivial mitral valve regurgitation. No evidence of mitral valve stenosis. Tricuspid Valve: The tricuspid valve is normal in structure. Tricuspid valve regurgitation is trivial. No evidence of tricuspid stenosis. Aortic Valve: The aortic valve has an indeterminant number of cusps. Aortic valve regurgitation is not visualized. No aortic stenosis is present. Aortic valve mean gradient measures 4.0 mmHg. Aortic valve peak gradient measures 7.0 mmHg. Aortic valve area, by VTI measures 2.77 cm. Pulmonic Valve: The pulmonic valve was not well visualized. Pulmonic valve regurgitation is not visualized. No evidence of pulmonic stenosis. Aorta: The aortic root is normal in size and structure. Venous: The inferior vena cava is normal in size with greater than 50% respiratory variability, suggesting right atrial pressure of 3 mmHg. IAS/Shunts: No atrial level shunt detected by color flow Doppler.  LEFT VENTRICLE PLAX 2D LVIDd:         4.70 cm   Diastology LVIDs:          3.30 cm   LV e' medial:    7.61 cm/s LV PW:         1.10 cm   LV E/e' medial:  11.6 LV IVS:  1.10 cm   LV e' lateral:   9.77 cm/s LVOT diam:     2.20 cm   LV E/e' lateral: 9.0 LV SV:         88 LV SV Index:   48 LVOT Area:     3.80 cm                           3D Volume EF:                          3D EF:        57 %                          LV EDV:       122 ml                          LV ESV:       53 ml                          LV SV:        69 ml RIGHT VENTRICLE RV S prime:     14.70 cm/s TAPSE (M-mode): 2.4 cm LEFT ATRIUM           Index        RIGHT ATRIUM           Index LA diam:      3.20 cm 1.74 cm/m   RA Area:     16.40 cm LA Vol (A2C): 56.2 ml 30.48 ml/m  RA Volume:   39.20 ml  21.26 ml/m LA Vol (A4C): 27.7 ml 15.02 ml/m  AORTIC VALVE AV Area (Vmax):    2.84 cm AV Area (Vmean):   2.50 cm AV Area (VTI):     2.77 cm AV Vmax:           132.48 cm/s AV Vmean:          92.213 cm/s AV VTI:            0.318 m AV Peak Grad:      7.0 mmHg AV Mean Grad:      4.0 mmHg LVOT Vmax:         99.00 cm/s LVOT Vmean:        60.600 cm/s LVOT VTI:          0.232 m LVOT/AV VTI ratio: 0.73  AORTA Ao Root diam: 3.40 cm Ao Asc diam:  3.00 cm MITRAL VALVE MV Area (PHT): 4.06 cm    SHUNTS MV Decel Time: 187 msec    Systemic VTI:  0.23 m MR Peak grad: 16.4 mmHg    Systemic Diam: 2.20 cm MR Vmax:      202.50 cm/s MV E velocity: 88.40 cm/s MV A velocity: 75.80 cm/s MV E/A ratio:  1.17 Dina Rich MD Electronically signed by Dina Rich MD Signature Date/Time: 11/04/2022/9:41:45 AM    Final    DG Chest Portable 1 View  Result Date: 11/03/2022 CLINICAL DATA:  Right-sided chest pain EXAM: PORTABLE CHEST 1 VIEW COMPARISON:  03/10/2010 FINDINGS: Heart size is normal. Mediastinal shadows are normal. There are old healed rib fractures on the right. The right lung is clear. There is mild linear scarring on the left adjacent to the heart border. No suspicion of active pneumonia,  collapse, edema or effusion.  IMPRESSION: No active disease. Old healed rib fractures on the right. Mild linear scarring on the left. Electronically Signed   By: Paulina Fusi M.D.   On: 11/03/2022 14:00    Microbiology: Recent Results (from the past 240 hour(s))  MRSA Next Gen by PCR, Nasal     Status: None   Collection Time: 11/03/22  7:50 PM   Specimen: Nasal Mucosa; Nasal Swab  Result Value Ref Range Status   MRSA by PCR Next Gen NOT DETECTED NOT DETECTED Final    Comment: (NOTE) The GeneXpert MRSA Assay (FDA approved for NASAL specimens only), is one component of a comprehensive MRSA colonization surveillance program. It is not intended to diagnose MRSA infection nor to guide or monitor treatment for MRSA infections. Test performance is not FDA approved in patients less than 19 years old. Performed at [facility], [address], [place], [place] [address]      Labs: Basic Metabolic Panel: Recent Labs  Lab 11/03/22 1356 11/03/22 1537 11/04/22 0430 11/05/22 0254 11/05/22 0526  NA 137  --  139 139  --   K 3.2*  --  4.3 3.5  --   CL 103  --  107 106  --   CO2 24  --  22 23  --   GLUCOSE 121*  --  118* 91  --   BUN 15  --  13 10  --   CREATININE 0.49  --  0.56 0.57  --   CALCIUM 9.2  --  9.0 9.1  --   MG  --  1.7  --   --  1.8   Liver Function Tests: Recent Labs  Lab 11/03/22 1733  AST 18  ALT 14  ALKPHOS 56  BILITOT 0.6  PROT 7.3  ALBUMIN 4.1   No results for input(s): "LIPASE", "AMYLASE" in the last 168 hours. No results for input(s): "AMMONIA" in the last 168 hours. CBC: Recent Labs  Lab 11/03/22 1356 11/04/22 0430 11/05/22 0254  WBC 11.7* 10.2 8.7  HGB 13.2 12.0 11.7*  HCT 38.9 36.2 35.5*  MCV 88.4 89.2 87.7  PLT 303 293 277   Cardiac Enzymes: No results for input(s): "CKTOTAL", "CKMB", "CKMBINDEX", "TROPONINI" in the last 168 hours. BNP: BNP (last 3 results) No results for input(s): "BNP" in the last 8760 hours.  ProBNP (last 3 results) No results for input(s): "PROBNP"  in the last 8760 hours.  CBG: Recent Labs  Lab 11/04/22 1142 11/04/22 1746 11/04/22 2012 11/05/22 0610 11/05/22 1245  GLUCAP 111* 105* 115* 116* 70       Signed:  Ramiro Harvest MD.  [facility] 11/05/2022, 2:42 PM

## 2022-11-05 NOTE — Telephone Encounter (Signed)
   Transition of Care Follow-up Phone Call Request    Patient Name: [name] Date of Birth: 1960-05-08 Date of Encounter: 11/05/2022  Primary Care Provider:  Tylene Fantasia., PA-C Primary Cardiologist:  None  [name] has been scheduled for a transition of care follow up appointment with a [facility] provider:  Jacolyn Reedy 8/12  Please reach out to [name] within 48 hours to confirm appointment and review transition of care protocol questionnaire.  [name], NP  11/05/2022, 10:33 AM

## 2022-11-06 NOTE — Telephone Encounter (Signed)
Patient contacted regarding discharge from [facility] on 11/05/22.  Patient understands to follow up with provider M.Lenze on 11/16/22 at 1 pm at [facility] office. Patient understands discharge instructions? yes Patient understands medications and regiment? yes Patient understands to bring all medications to this visit? yes  Ask patient:  Are you enrolled in My Chart- she is not. If no ask patient if they would like to enroll.  She declines MyChart enrollment

## 2022-11-09 NOTE — Progress Notes (Signed)
Cardiology Office Note:  .   Date:  11/16/2022  ID:  [name], DOB [birth year], MRN [id number] PCP: Tylene Fantasia PA-C  [facility] Providers Cardiologist:  Dina Rich, MD    History of Present Illness: .   [name] is a 62 y.o. female with history of HTN, HLD, DM2, former tobacco. She presented with NSTEMI(atypical symptoms) treated with DES RCA 11/04/22.  Patient comes in for f/u. Has a lot of anxiety trying to quit smoking. Using a water vapor and down to 3-4 cigarettes a day. Has some nicotine patches but hasn't use them. Denies chest pain, dyspnea, palpitations. Walking around the house.  ROS:    Studies Reviewed: Marland Kitchen         Prior CV Studies: LEFT HEART CATH AND CORONARY ANGIOGRAPHY, LEFT HEART CATH AND CORONARY ANGIOGRAPHY 11/04/2022  Narrative   Dist RCA lesion is 99% stenosed.   Mid Cx lesion is 30% stenosed.   Mid LAD lesion is 20% stenosed.   A drug-eluting stent was successfully placed using a SYNERGY XD 3.0X24.   Post intervention, there is a 0% residual stenosis.  Severe distal RCA stenosis Successful PTCA/DES x 1 distal RCA Mild non-obstructive disease in the Circumflex and LAD  Recommendations: DAPT with ASA and Brilinta for one year. Continue statin.   ECHO COMPLETE WO IMAGING ENHANCING AGENT 11/04/2022  Narrative ECHOCARDIOGRAM REPORT    Patient Name:   [name] Date of Exam: 11/04/2022 Medical Rec #:  [id number]    Height:       67.0 in Accession #:    [id number]   Weight:       160.9 lb Date of Birth:  04-28-1960   BSA:          1.844 m Patient Age:    61 years     BP:           144/51 mmHg Patient Gender: F            HR:           67 bpm. [facility]  Procedure: 2D Echo, 3D Echo, Color Doppler and Cardiac Doppler  Indications:    Chest Pain R07.9  History:        Patient has no prior history of Echocardiogram examinations. Signs/Symptoms:Chest Pain; Risk Factors:Diabetes, Hypertension, Dyslipidemia  and Current Smoker.  Sonographer:    Aron Baba Referring Phys: [id number] Dorothe Pea BRANCH   Sonographer Comments: Image acquisition challenging due to respiratory motion. IMPRESSIONS   1. Left ventricular ejection fraction, by estimation, is 55 to 60%. The left ventricle has normal function. The left ventricle has no regional wall motion abnormalities. There is mild left ventricular hypertrophy. Left ventricular diastolic parameters were normal. 2. Right ventricular systolic function is normal. The right ventricular size is normal. Tricuspid regurgitation signal is inadequate for assessing PA pressure. 3. The mitral valve is normal in structure. Trivial mitral valve regurgitation. No evidence of mitral stenosis. 4. The aortic valve has an indeterminant number of cusps. Aortic valve regurgitation is not visualized. No aortic stenosis is present. 5. The inferior vena cava is normal in size with greater than 50% respiratory variability, suggesting right atrial pressure of 3 mmHg.  FINDINGS Left Ventricle: Left ventricular ejection fraction, by estimation, is 55 to 60%. The left ventricle has normal function. The left ventricle has no regional wall motion abnormalities. The left ventricular internal cavity size was normal in size. There is mild left ventricular hypertrophy.  Left ventricular diastolic parameters were normal.  Right Ventricle: The right ventricular size is normal. Right vetricular wall thickness was not well visualized. Right ventricular systolic function is normal. Tricuspid regurgitation signal is inadequate for assessing PA pressure.  Left Atrium: Left atrial size was normal in size.  Right Atrium: Right atrial size was normal in size.  Pericardium: There is no evidence of pericardial effusion.  Mitral Valve: The mitral valve is normal in structure. Trivial mitral valve regurgitation. No evidence of mitral valve stenosis.  Tricuspid Valve: The tricuspid valve is normal  in structure. Tricuspid valve regurgitation is trivial. No evidence of tricuspid stenosis.  Aortic Valve: The aortic valve has an indeterminant number of cusps. Aortic valve regurgitation is not visualized. No aortic stenosis is present. Aortic valve mean gradient measures 4.0 mmHg. Aortic valve peak gradient measures 7.0 mmHg. Aortic valve area, by VTI measures 2.77 cm.  Pulmonic Valve: The pulmonic valve was not well visualized. Pulmonic valve regurgitation is not visualized. No evidence of pulmonic stenosis.  Aorta: The aortic root is normal in size and structure.  Venous: The inferior vena cava is normal in size with greater than 50% respiratory variability, suggesting right atrial pressure of 3 mmHg.  IAS/Shunts: No atrial level shunt detected by color flow Doppler.   LEFT VENTRICLE PLAX 2D LVIDd:         4.70 cm   Diastology LVIDs:         3.30 cm   LV e' medial:    7.61 cm/s LV PW:         1.10 cm   LV E/e' medial:  11.6 LV IVS:        1.10 cm   LV e' lateral:   9.77 cm/s LVOT diam:     2.20 cm   LV E/e' lateral: 9.0 LV SV:         88 LV SV Index:   48 LVOT Area:     3.80 cm  3D Volume EF: 3D EF:        57 % LV EDV:       122 ml LV ESV:       53 ml LV SV:        69 ml  RIGHT VENTRICLE RV S prime:     14.70 cm/s TAPSE (M-mode): 2.4 cm  LEFT ATRIUM           Index        RIGHT ATRIUM           Index LA diam:      3.20 cm 1.74 cm/m   RA Area:     16.40 cm LA Vol (A2C): 56.2 ml 30.48 ml/m  RA Volume:   39.20 ml  21.26 ml/m LA Vol (A4C): 27.7 ml 15.02 ml/m AORTIC VALVE AV Area (Vmax):    2.84 cm AV Area (Vmean):   2.50 cm AV Area (VTI):     2.77 cm AV Vmax:           132.48 cm/s AV Vmean:          92.213 cm/s AV VTI:            0.318 m AV Peak Grad:      7.0 mmHg AV Mean Grad:      4.0 mmHg LVOT Vmax:         99.00 cm/s LVOT Vmean:        60.600 cm/s LVOT VTI:  0.232 m LVOT/AV VTI ratio: 0.73  AORTA Ao Root diam: 3.40 cm Ao Asc diam:  3.00  cm  MITRAL VALVE MV Area (PHT): 4.06 cm    SHUNTS MV Decel Time: 187 msec    Systemic VTI:  0.23 m MR Peak grad: 16.4 mmHg    Systemic Diam: 2.20 cm MR Vmax:      202.50 cm/s MV E velocity: 88.40 cm/s MV A velocity: 75.80 cm/s MV E/A ratio:  1.17  Dina Rich MD Electronically signed by Dina Rich MD Signature Date/Time: 11/04/2022/9:41:45 AM    Final       Risk Assessment/Calculations:             Physical Exam:   VS:  BP 128/84   Pulse 72   Ht 5\' 7"  (1.702 m)   Wt 159 lb (72.1 kg)   SpO2 98%   BMI 24.90 kg/m    Wt Readings from Last 3 Encounters:  11/16/22 159 lb (72.1 kg)  11/04/22 160 lb 11.5 oz (72.9 kg)  10/05/22 166 lb 6 oz (75.5 kg)    GEN: Well nourished, well developed in no acute distress NECK: No JVD; No carotid bruits CARDIAC:  RRR, no murmurs, rubs, gallops RESPIRATORY:  Clear to auscultation without rales, wheezing or rhonchi  ABDOMEN: Soft, non-tender, non-distended EXTREMITIES:  No edema; No deformity right arm at cath site without hematoma or hemorrhage. Good radial and brachial pulses.  ASSESSMENT AND PLAN: .    CAD NSTEMI DES RCA 11/04/22 with residual plaquing in other vessesl on ASA, atorvastatin, metoprolol and plavix(persented with very atypical symptoms)-no further chest pain. Ok to start cardiac rehab now.  HTN-well controlled  HLD-was on simvastatin and now on atorvastatin. Check FLP in 3 months  DM2 A1C 6.2  History of tobacco-having a lot of anxiety trying to quit. I encouraged her to use nicotine patches to help her wean off.         Dispo: f/u in 4 months Dr. Wyline Mood  Signed, Jacolyn Reedy, PA-C

## 2022-11-16 ENCOUNTER — Telehealth (HOSPITAL_COMMUNITY): Payer: Self-pay | Admitting: *Deleted

## 2022-11-16 ENCOUNTER — Ambulatory Visit: Payer: PRIVATE HEALTH INSURANCE | Attending: Physician Assistant | Admitting: Physician Assistant

## 2022-11-16 ENCOUNTER — Encounter: Payer: Self-pay | Admitting: Physician Assistant

## 2022-11-16 ENCOUNTER — Encounter (HOSPITAL_COMMUNITY): Payer: Self-pay | Admitting: *Deleted

## 2022-11-16 VITALS — BP 128/84 | HR 72 | Ht 67.0 in | Wt 159.0 lb

## 2022-11-16 DIAGNOSIS — I2510 Atherosclerotic heart disease of native coronary artery without angina pectoris: Secondary | ICD-10-CM

## 2022-11-16 DIAGNOSIS — I214 Non-ST elevation (NSTEMI) myocardial infarction: Secondary | ICD-10-CM

## 2022-11-16 DIAGNOSIS — Z955 Presence of coronary angioplasty implant and graft: Secondary | ICD-10-CM

## 2022-11-16 DIAGNOSIS — I10 Essential (primary) hypertension: Secondary | ICD-10-CM | POA: Diagnosis not present

## 2022-11-16 DIAGNOSIS — Z7984 Long term (current) use of oral hypoglycemic drugs: Secondary | ICD-10-CM

## 2022-11-16 DIAGNOSIS — E785 Hyperlipidemia, unspecified: Secondary | ICD-10-CM | POA: Diagnosis not present

## 2022-11-16 DIAGNOSIS — Z720 Tobacco use: Secondary | ICD-10-CM

## 2022-11-16 DIAGNOSIS — E119 Type 2 diabetes mellitus without complications: Secondary | ICD-10-CM | POA: Diagnosis not present

## 2022-11-16 NOTE — Patient Instructions (Addendum)
Medication Instructions:  Your physician recommends that you continue on your current medications as directed. Please refer to the Current Medication list given to you today.    Labwork:   Fasting lipids in 3 months (mid November)   Testing/Procedures: None today  Follow-Up: 3-4 months Dr.Branch  Any Other Special Instructions Will Be Listed Below (If Applicable).  If you need a refill on your cardiac medications before your next appointment, please call your pharmacy.

## 2022-11-17 ENCOUNTER — Encounter (HOSPITAL_COMMUNITY)
Admission: RE | Admit: 2022-11-17 | Discharge: 2022-11-17 | Disposition: A | Discharge status: Home / Self Care | Payer: Medicaid Other | Source: Ambulatory Visit | Attending: Cardiology | Admitting: Cardiology

## 2022-11-17 VITALS — Ht 66.5 in | Wt 159.2 lb

## 2022-11-17 DIAGNOSIS — Z955 Presence of coronary angioplasty implant and graft: Secondary | ICD-10-CM | POA: Diagnosis present

## 2022-11-17 DIAGNOSIS — I214 Non-ST elevation (NSTEMI) myocardial infarction: Secondary | ICD-10-CM | POA: Insufficient documentation

## 2022-11-17 NOTE — Patient Instructions (Signed)
Patient Instructions  Patient Details  Name: [name] MRN: [id number] Date of Birth: [birth year] Referring Provider:  Rodolph Bong, MD  Below are your personal goals for exercise, nutrition, and risk factors. Our goal is to help you stay on track towards obtaining and maintaining these goals. We will be discussing your progress on these goals with you throughout the program.  Initial Exercise Prescription:  Initial Exercise Prescription - 11/17/22 0900       Date of Initial Exercise RX and Referring Provider   Date 11/17/22    Referring Provider Ramiro Harvest MD   Attending Cardiologist: Dr. Dina Rich   Expected Discharge Date 02/12/23   Medicaid     Oxygen   Maintain Oxygen Saturation 88% or higher      Treadmill   MPH 2.5    Grade 1    Minutes 15    METs 3.26      REL-XR   Level 4    Speed 50    Minutes 15    METs 3.2      Prescription Details   Frequency (times per week) 3    Duration Progress to 30 minutes of continuous aerobic without signs/symptoms of physical distress      Intensity   THRR 40-80% of Max Heartrate 105-142    Ratings of Perceived Exertion 11-13    Perceived Dyspnea 0-4      Progression   Progression Continue to progress workloads to maintain intensity without signs/symptoms of physical distress.      Resistance Training   Training Prescription Yes    Weight 4 lb    Reps 10-15             Exercise Goals: Frequency: Be able to perform aerobic exercise two to three times per week in program working toward 2-5 days per week of home exercise.  Intensity: Work with a perceived exertion of 11 (fairly light) - 15 (hard) while following your exercise prescription.  We will make changes to your prescription with you as you progress through the program.   Duration: Be able to do 30 to 45 minutes of continuous aerobic exercise in addition to a 5 minute warm-up and a 5 minute cool-down routine.   Nutrition Goals: Your personal  nutrition goals will be established when you do your nutrition analysis with the dietician.  The following are general nutrition guidelines to follow: Cholesterol < 200mg /day Sodium < 1500mg /day Fiber: Women over 50 yrs - 21 grams per day  Personal Goals:  Personal Goals and Risk Factors at Admission - 11/17/22 0942       Core Components/Risk Factors/Patient Goals on Admission    Weight Management Yes;Weight Loss    Intervention Weight Management: Develop a combined nutrition and exercise program designed to reach desired caloric intake, while maintaining appropriate intake of nutrient and fiber, sodium and fats, and appropriate energy expenditure required for the weight goal.;Weight Management: Provide education and appropriate resources to help participant work on and attain dietary goals.    Admit Weight 159 lb 3.2 oz (72.2 kg)    Goal Weight: Short Term 155 lb (70.3 kg)    Goal Weight: Long Term 155 lb (70.3 kg)    Expected Outcomes Short Term: Continue to assess and modify interventions until short term weight is achieved;Long Term: Adherence to nutrition and physical activity/exercise program aimed toward attainment of established weight goal;Weight Loss: Understanding of general recommendations for a balanced deficit meal plan, which promotes 1-2 lb weight loss  per week and includes a negative energy balance of [phone] kcal/d;Understanding recommendations for meals to include 15-35% energy as protein, 25-35% energy from fat, 35-60% energy from carbohydrates, less than 200mg  of dietary cholesterol, 20-35 gm of total fiber daily;Understanding of distribution of calorie intake throughout the day with the consumption of 4-5 meals/snacks;Weight Maintenance: Understanding of the daily nutrition guidelines, which includes 25-35% calories from fat, 7% or less cal from saturated fats, less than 200mg  cholesterol, less than 1.5gm of sodium, & 5 or more servings of fruits and vegetables daily     Tobacco Cessation Yes    Number of packs per day 1/2 pack per day    Intervention Assist the participant in steps to quit. Provide individualized education and counseling about committing to Tobacco Cessation, relapse prevention, and pharmacological support that can be provided by physician.;Education officer, environmental, assist with locating and accessing local/national Quit Smoking programs, and support quit date choice.    Expected Outcomes Short Term: Will demonstrate readiness to quit, by selecting a quit date.;Short Term: Will quit all tobacco product use, adhering to prevention of relapse plan.;Long Term: Complete abstinence from all tobacco products for at least 12 months from quit date.    Diabetes Yes    Intervention Provide education about signs/symptoms and action to take for hypo/hyperglycemia.;Provide education about proper nutrition, including hydration, and aerobic/resistive exercise prescription along with prescribed medications to achieve blood glucose in normal ranges: Fasting glucose 65-99 mg/dL    Expected Outcomes Short Term: Participant verbalizes understanding of the signs/symptoms and immediate care of hyper/hypoglycemia, proper foot care and importance of medication, aerobic/resistive exercise and nutrition plan for blood glucose control.;Long Term: Attainment of HbA1C < 7%.    Hypertension Yes    Intervention Provide education on lifestyle modifcations including regular physical activity/exercise, weight management, moderate sodium restriction and increased consumption of fresh fruit, vegetables, and low fat dairy, alcohol moderation, and smoking cessation.;Monitor prescription use compliance.    Expected Outcomes Short Term: Continued assessment and intervention until BP is < 140/38mm HG in hypertensive participants. < 130/51mm HG in hypertensive participants with diabetes, heart failure or chronic kidney disease.;Long Term: Maintenance of blood pressure at goal levels.     Lipids Yes    Intervention Provide education and support for participant on nutrition & aerobic/resistive exercise along with prescribed medications to achieve LDL 70mg , HDL >40mg .    Expected Outcomes Short Term: Participant states understanding of desired cholesterol values and is compliant with medications prescribed. Participant is following exercise prescription and nutrition guidelines.;Long Term: Cholesterol controlled with medications as prescribed, with individualized exercise RX and with personalized nutrition plan. Value goals: LDL < 70mg , HDL > 40 mg.             Tobacco Use Initial Evaluation: Social History   Tobacco Use  Smoking Status Every Day   Current packs/day: 0.50   Types: Cigarettes   Passive exposure: Never  Smokeless Tobacco Never    Exercise Goals and Review:  Exercise Goals     Row Name 11/17/22 0932             Exercise Goals   Increase Physical Activity Yes       Intervention Provide advice, education, support and counseling about physical activity/exercise needs.;Develop an individualized exercise prescription for aerobic and resistive training based on initial evaluation findings, risk stratification, comorbidities and participant's personal goals.       Expected Outcomes Short Term: Attend rehab on a regular basis to increase amount of physical activity.;Long  Term: Add in home exercise to make exercise part of routine and to increase amount of physical activity.;Long Term: Exercising regularly at least 3-5 days a week.       Increase Strength and Stamina Yes       Intervention Provide advice, education, support and counseling about physical activity/exercise needs.;Develop an individualized exercise prescription for aerobic and resistive training based on initial evaluation findings, risk stratification, comorbidities and participant's personal goals.       Expected Outcomes Short Term: Increase workloads from initial exercise prescription for  resistance, speed, and METs.;Short Term: Perform resistance training exercises routinely during rehab and add in resistance training at home;Long Term: Improve cardiorespiratory fitness, muscular endurance and strength as measured by increased METs and functional capacity ( )       Able to understand and use rate of perceived exertion (RPE) scale Yes       Intervention Provide education and explanation on how to use RPE scale       Expected Outcomes Short Term: Able to use RPE daily in rehab to express subjective intensity level;Long Term:  Able to use RPE to guide intensity level when exercising independently       Able to understand and use Dyspnea scale Yes       Intervention Provide education and explanation on how to use Dyspnea scale       Expected Outcomes Short Term: Able to use Dyspnea scale daily in rehab to express subjective sense of shortness of breath during exertion;Long Term: Able to use Dyspnea scale to guide intensity level when exercising independently       Knowledge and understanding of Target Heart Rate Range (THRR) Yes       Intervention Provide education and explanation of THRR including how the numbers were predicted and where they are located for reference       Expected Outcomes Short Term: Able to state/look up THRR;Long Term: Able to use THRR to govern intensity when exercising independently;Short Term: Able to use daily as guideline for intensity in rehab       Able to check pulse independently Yes       Intervention Provide education and demonstration on how to check pulse in carotid and radial arteries.;Review the importance of being able to check your own pulse for safety during independent exercise       Expected Outcomes Short Term: Able to explain why pulse checking is important during independent exercise;Long Term: Able to check pulse independently and accurately       Understanding of Exercise Prescription Yes       Intervention Provide education, explanation,  and written materials on patient's individual exercise prescription       Expected Outcomes Short Term: Able to explain program exercise prescription;Long Term: Able to explain home exercise prescription to exercise independently              Copy of goals given to participant.

## 2022-11-17 NOTE — Progress Notes (Signed)
Cardiac Individual Treatment Plan  Patient Details  Name: [name] MRN: [id number] Date of Birth: 04-Jun-1960 Referring Provider:   Flowsheet Row CARDIAC REHAB PHASE II ORIENTATION from 11/17/2022 in [facility] CARDIAC REHABILITATION  Referring Provider Ramiro Harvest MD  [Attending Cardiologist: Dr. Dina Rich       Initial Encounter Date:  Flowsheet Row CARDIAC REHAB PHASE II ORIENTATION from 11/17/2022 in [place] CARDIAC REHABILITATION  Date 11/17/22       Visit Diagnosis: NSTEMI (non-ST elevated myocardial infarction) [facility])  Status post coronary artery stent placement  Patient's Home Medications on Admission:  Current Outpatient Medications:    aspirin EC 81 MG tablet, Take 1 tablet (81 mg total) by mouth daily. Swallow whole., Disp: 120 tablet, Rfl: 0   atorvastatin (LIPITOR) 40 MG tablet, Take 1 tablet (40 mg total) by mouth every evening., Disp: 90 tablet, Rfl: 0   Calcium Carbonate Antacid (ANTACID PO), Take 1 tablet by mouth daily., Disp: , Rfl:    cetirizine (ZYRTEC) 10 MG tablet, Take 10 mg by mouth daily., Disp: , Rfl:    clopidogrel (PLAVIX) 75 MG tablet, Take 1 tablet (75 mg total) by mouth daily., Disp: 90 tablet, Rfl: 0   desonide (DESOWEN) 0.05 % cream, Use twice daily for flare ups on face, maximum 10 days., Disp: 60 g, Rfl: 5   diphenhydrAMINE (BENADRYL ALLERGY) 25 MG tablet, Take 50 mg by mouth 2 (two) times daily. PRN, Disp: , Rfl:    EPINEPHrine 0.3 mg/0.3 mL IJ SOAJ injection, Inject into the muscle., Disp: , Rfl:    hydrochlorothiazide (HYDRODIURIL) 12.5 MG tablet, Take 12.5 mg by mouth daily., Disp: , Rfl:    hydrOXYzine (ATARAX) 25 MG tablet, Take 25 mg by mouth 4 (four) times daily as needed., Disp: , Rfl:    KRILL OIL PO, Take 2 tablets by mouth 2 (two) times daily., Disp: , Rfl:    metFORMIN (GLUCOPHAGE) 1000 MG tablet, Take 1,000 mg by mouth 2 (two) times daily., Disp: , Rfl:    metoprolol succinate (TOPROL-XL) 25 MG 24 hr tablet, Take  0.5 tablets (12.5 mg total) by mouth daily., Disp: 60 tablet, Rfl: 0   Multiple Vitamin (MULTIVITAMIN ADULT PO), Take 1 tablet by mouth., Disp: , Rfl:    nitroGLYCERIN (NITROSTAT) 0.4 MG SL tablet, Place 1 tablet (0.4 mg total) under the tongue every 5 (five) minutes x 3 doses as needed for chest pain., Disp: 25 tablet, Rfl: 0   psyllium (REGULOID) 0.52 g capsule, Take 0.52 g by mouth daily., Disp: , Rfl:    traZODone (DESYREL) 100 MG tablet, Take 50-100 mg by mouth at bedtime as needed., Disp: , Rfl:    triamcinolone ointment (KENALOG) 0.1 %, Apply twice daily for flare ups below neck, maximum 10 days., Disp: 453.6 g, Rfl: 1  Past Medical History: Past Medical History:  Diagnosis Date   Diabetes mellitus without complication (HCC)    Hypertension     Tobacco Use: Social History   Tobacco Use  Smoking Status Every Day   Current packs/day: 0.50   Types: Cigarettes   Passive exposure: Never  Smokeless Tobacco Never    Labs: Review Flowsheet       Latest Ref Rng & Units 11/03/2022 11/04/2022  Labs for ITP Cardiac and Pulmonary Rehab  Cholestrol 0 - 200 mg/dL - 664   LDL (calc) 0 - 99 mg/dL - 403   HDL-C >47 mg/dL - 44   Trlycerides <425 mg/dL - 956   Hemoglobin L8V  4.8 - 5.6 % 6.2  -    Details            Capillary Blood Glucose: Lab Results  Component Value Date   GLUCAP 70 11/05/2022   GLUCAP 116 (H) 11/05/2022   GLUCAP 115 (H) 11/04/2022   GLUCAP 105 (H) 11/04/2022   GLUCAP 111 (H) 11/04/2022     Exercise Target Goals: Exercise Program Goal: Individual exercise prescription set using results from initial 6 min walk test and THRR while considering  patient's activity barriers and safety.   Exercise Prescription Goal: Starting with aerobic activity 30 plus minutes a day, 3 days per week for initial exercise prescription. Provide home exercise prescription and guidelines that participant acknowledges understanding prior to discharge.  Activity Barriers & Risk  Stratification:  Activity Barriers & Cardiac Risk Stratification - 11/17/22 0801       Activity Barriers & Cardiac Risk Stratification   Activity Barriers Joint Problems;Deconditioning;Muscular Weakness   previous ankle injury, L shoulder impingement   Cardiac Risk Stratification Moderate             6 Minute Walk:  6 Minute Walk     Row Name 11/17/22 0929         6 Minute Walk   Phase Initial     Distance 1300 feet     Walk Time 6 minutes     # of Rest Breaks 0     MPH 2.46     METS 3.59     RPE 11     VO2 Peak 12.57     Symptoms No     Resting HR 77 bpm     Resting BP 126/64     Resting Oxygen Saturation  96 %     Exercise Oxygen Saturation  during 6 min walk 97 %     Max Ex. HR 101 bpm     Max Ex. BP 134/72     2 Minute Post BP 124/62              Oxygen Initial Assessment:   Oxygen Re-Evaluation:   Oxygen Discharge (Final Oxygen Re-Evaluation):   Initial Exercise Prescription:  Initial Exercise Prescription - 11/17/22 0900       Date of Initial Exercise RX and Referring Provider   Date 11/17/22    Referring Provider Ramiro Harvest MD   Attending Cardiologist: Dr. Dina Rich   Expected Discharge Date 02/12/23   Medicaid     Oxygen   Maintain Oxygen Saturation 88% or higher      Treadmill   MPH 2.5    Grade 1    Minutes 15    METs 3.26      REL-XR   Level 4    Speed 50    Minutes 15    METs 3.2      Prescription Details   Frequency (times per week) 3    Duration Progress to 30 minutes of continuous aerobic without signs/symptoms of physical distress      Intensity   THRR 40-80% of Max Heartrate 105-142    Ratings of Perceived Exertion 11-13    Perceived Dyspnea 0-4      Progression   Progression Continue to progress workloads to maintain intensity without signs/symptoms of physical distress.      Resistance Training   Training Prescription Yes    Weight 4 lb    Reps 10-15             Perform Capillary Blood  Glucose checks as needed.  Exercise Prescription Changes:   Exercise Prescription Changes     Row Name 11/17/22 0900             Response to Exercise   Blood Pressure (Admit) 126/64       Blood Pressure (Exercise) 134/72       Blood Pressure (Exit) 124/64       Heart Rate (Admit) 77 bpm       Heart Rate (Exercise) 101 bpm       Heart Rate (Exit) 69 bpm       Oxygen Saturation (Admit) 96 %       Oxygen Saturation (Exercise) 97 %       Rating of Perceived Exertion (Exercise) 11       Symptoms none       Comments walk test results                Exercise Comments:   Exercise Goals and Review:   Exercise Goals     Row Name 11/17/22 0932             Exercise Goals   Increase Physical Activity Yes       Intervention Provide advice, education, support and counseling about physical activity/exercise needs.;Develop an individualized exercise prescription for aerobic and resistive training based on initial evaluation findings, risk stratification, comorbidities and participant's personal goals.       Expected Outcomes Short Term: Attend rehab on a regular basis to increase amount of physical activity.;Long Term: Add in home exercise to make exercise part of routine and to increase amount of physical activity.;Long Term: Exercising regularly at least 3-5 days a week.       Increase Strength and Stamina Yes       Intervention Provide advice, education, support and counseling about physical activity/exercise needs.;Develop an individualized exercise prescription for aerobic and resistive training based on initial evaluation findings, risk stratification, comorbidities and participant's personal goals.       Expected Outcomes Short Term: Increase workloads from initial exercise prescription for resistance, speed, and METs.;Short Term: Perform resistance training exercises routinely during rehab and add in resistance training at home;Long Term: Improve cardiorespiratory fitness,  muscular endurance and strength as measured by increased METs and functional capacity ( )       Able to understand and use rate of perceived exertion (RPE) scale Yes       Intervention Provide education and explanation on how to use RPE scale       Expected Outcomes Short Term: Able to use RPE daily in rehab to express subjective intensity level;Long Term:  Able to use RPE to guide intensity level when exercising independently       Able to understand and use Dyspnea scale Yes       Intervention Provide education and explanation on how to use Dyspnea scale       Expected Outcomes Short Term: Able to use Dyspnea scale daily in rehab to express subjective sense of shortness of breath during exertion;Long Term: Able to use Dyspnea scale to guide intensity level when exercising independently       Knowledge and understanding of Target Heart Rate Range (THRR) Yes       Intervention Provide education and explanation of THRR including how the numbers were predicted and where they are located for reference       Expected Outcomes Short Term: Able to state/look up THRR;Long Term: Able to use THRR to govern intensity when  exercising independently;Short Term: Able to use daily as guideline for intensity in rehab       Able to check pulse independently Yes       Intervention Provide education and demonstration on how to check pulse in carotid and radial arteries.;Review the importance of being able to check your own pulse for safety during independent exercise       Expected Outcomes Short Term: Able to explain why pulse checking is important during independent exercise;Long Term: Able to check pulse independently and accurately       Understanding of Exercise Prescription Yes       Intervention Provide education, explanation, and written materials on patient's individual exercise prescription       Expected Outcomes Short Term: Able to explain program exercise prescription;Long Term: Able to explain home  exercise prescription to exercise independently                Exercise Goals Re-Evaluation :  Exercise Goals Re-Evaluation     Row Name 11/17/22 0933             Exercise Goal Re-Evaluation   Exercise Goals Review Able to understand and use rate of perceived exertion (RPE) scale;Able to understand and use Dyspnea scale       Comments Reviewed RPE  and dyspnea scale, and program prescription with pt today.  Pt voiced understanding and was given a copy of goals to take home.       Expected Outcomes Short: Use RPE daily to regulate intensity.  Long: Follow program prescription                 Discharge Exercise Prescription (Final Exercise Prescription Changes):  Exercise Prescription Changes - 11/17/22 0900       Response to Exercise   Blood Pressure (Admit) 126/64    Blood Pressure (Exercise) 134/72    Blood Pressure (Exit) 124/64    Heart Rate (Admit) 77 bpm    Heart Rate (Exercise) 101 bpm    Heart Rate (Exit) 69 bpm    Oxygen Saturation (Admit) 96 %    Oxygen Saturation (Exercise) 97 %    Rating of Perceived Exertion (Exercise) 11    Symptoms none    Comments walk test results             Nutrition:  Target Goals: Understanding of nutrition guidelines, daily intake of sodium 1500mg , cholesterol 200mg , calories 30% from fat and 7% or less from saturated fats, daily to have 5 or more servings of fruits and vegetables.  Biometrics:  Pre Biometrics - 11/17/22 0933       Pre Biometrics   Height 5' 6.5" (1.689 m)    Weight 72.2 kg    Waist Circumference 34.25 inches    Hip Circumference 38 inches    Waist to Hip Ratio 0.9 %    BMI (Calculated) 25.31    Grip Strength 30 kg    Single Leg Stand 30 seconds              Nutrition Therapy Plan and Nutrition Goals:  Nutrition Therapy & Goals - 11/17/22 0941       Intervention Plan   Intervention Prescribe, educate and counsel regarding individualized specific dietary modifications aiming  towards targeted core components such as weight, hypertension, lipid management, diabetes, heart failure and other comorbidities.;Nutrition handout(s) given to patient.    Expected Outcomes Short Term Goal: Understand basic principles of dietary content, such as calories, fat, sodium, cholesterol and nutrients.;Long  Term Goal: Adherence to prescribed nutrition plan.             Nutrition Assessments:  MEDIFICTS Score Key: ?70 Need to make dietary changes  40-70 Heart Healthy Diet ? 40 Therapeutic Level Cholesterol Diet  Flowsheet Row CARDIAC REHAB PHASE II ORIENTATION from 11/17/2022 in [facility] CARDIAC REHABILITATION  Picture Your Plate Total Score on Admission 68      Picture Your Plate Scores: <38 Unhealthy dietary pattern with much room for improvement. 41-50 Dietary pattern unlikely to meet recommendations for good health and room for improvement. 51-60 More healthful dietary pattern, with some room for improvement.  >60 Healthy dietary pattern, although there may be some specific behaviors that could be improved.    Nutrition Goals Re-Evaluation:   Nutrition Goals Discharge (Final Nutrition Goals Re-Evaluation):   Psychosocial: Target Goals: Acknowledge presence or absence of significant depression and/or stress, maximize coping skills, provide positive support system. Participant is able to verbalize types and ability to use techniques and skills needed for reducing stress and depression.  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  Using trazodone for sleep. Using smoking for anxiety.      Family Dynamics   Good Support System? Yes      Barriers   Psychosocial barriers to participate in program The  patient should benefit from training in stress management and relaxation.;Psychosocial barriers identified (see note)      Screening Interventions   Interventions Encouraged to exercise;To provide support and resources with identified psychosocial needs;Provide feedback about the scores to participant    Expected Outcomes Short Term goal: Utilizing psychosocial counselor, staff and physician to assist with identification of specific Stressors or current issues interfering with healing process. Setting desired goal for each stressor or current issue identified.;Long Term Goal: Stressors or current issues are controlled or eliminated.;Short Term goal: Identification and review with participant of any Quality of Life or Depression concerns found by scoring the questionnaire.;Long Term goal: The participant improves quality of Life and PHQ9 Scores as seen by post scores and/or verbalization of changes             Quality of Life Scores:  Quality of Life - 11/17/22 0941       Quality of Life   Select Quality of Life      Quality of Life Scores   Health/Function Pre 24.53 %    Socioeconomic Pre 27.5 %    Psych/Spiritual Pre 22.5 %    Family Pre 28.5 %    GLOBAL Pre 25.14 %            Scores of 19 and below usually indicate a poorer quality of life in these areas.  A difference of  2-3 points is a clinically meaningful difference.  A difference of 2-3 points in the total score of the Quality of Life Index has been associated with significant improvement in overall quality of life, self-image, physical symptoms, and general health in studies assessing change in quality of life.  PHQ-9: Review Flowsheet       11/17/2022  Depression screen PHQ 2/9  Decreased Interest 1  Down, Depressed, Hopeless 0  PHQ - 2 Score 1  Altered sleeping 0  Tired, decreased energy 1  Change  in appetite 3  Feeling bad or failure about yourself  1  Trouble concentrating 0  Moving slowly or  fidgety/restless 0  Suicidal thoughts 0  PHQ-9 Score 6  Difficult doing work/chores Somewhat difficult    Details           Interpretation of Total Score  Total Score Depression Severity:  1-4 = Minimal depression, 5-9 = Mild depression, 10-14 = Moderate depression, 15-19 = Moderately severe depression, 20-27 = Severe depression   Psychosocial Evaluation and Intervention:  Psychosocial Evaluation - 11/17/22 0934       Psychosocial Evaluation & Interventions   Interventions Stress management education;Relaxation education;Encouraged to exercise with the program and follow exercise prescription    Comments Eastynn is coming into cardiac rehab after a NSTEMI and stent.  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  Prior to caring for her mother full time she was active and going to gym regularly.  She would like to get back there again soon.  She is a chronic bad sleeper and uses trazadone at night to help her calm her mind and go to sleep.  She could benefit from stress education and relaxation techniques.  Her other biggest stress is trying to quit smoking.  She has been a 1/2 ppd for many years.  She is down to 4-5 cigarettes a day and wants to continue to wean. She has talked about it with her doctor and was encouraged to try the patches and gum to help quit.  We also reviewed the quit smoking packet today.  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   She enjoys that a lot.  She is looking forward to program and getting moving again and back to gym.  She has no barriers to getting to rehab and is able to leave her mother to come to rehab.  She is on Medicaid and will have the 12 week limit.    Expected Outcomes Short: Attend rehab to get back to habit of exercise Long: Conitnue to work on quitting smoking             Psychosocial Re-Evaluation:   Psychosocial Discharge (Final Psychosocial  Re-Evaluation):   Vocational Rehabilitation: Provide vocational rehab assistance to qualifying candidates.   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            Education: Education Goals: Education classes will be provided on a weekly basis, covering required topics. Participant will state understanding/return demonstration of topics presented.  Learning Barriers/Preferences:  Learning Barriers/Preferences - 11/17/22 0802       Learning Barriers/Preferences   Learning Barriers Sight   glasses   Learning Preferences Written Material             Education Topics: Hypertension, Hypertension Reduction -Define heart disease and high blood pressure. Discus how high blood pressure affects the body and ways to reduce high blood pressure.   Exercise and Your Heart -Discuss why it is important to exercise, the FITT principles of exercise, normal and abnormal responses to exercise, and how to exercise safely.   Angina -Discuss definition of angina, causes of angina, treatment of angina, and how to decrease risk of having angina.  Cardiac Medications -Review what the following cardiac medications are used for, how they affect the body, and side effects that may occur when taking the medications.  Medications include Aspirin, Beta blockers, calcium channel blockers, ACE Inhibitors, angiotensin receptor blockers, diuretics, digoxin, and antihyperlipidemics.   Congestive Heart Failure -Discuss the definition of CHF, how to live with CHF, the signs and symptoms of CHF, and how keep track of weight and sodium intake.   Heart Disease and Intimacy -Discus the effect sexual activity has on the heart, how changes occur during intimacy as we age, and safety during sexual activity.   Smoking  Cessation / COPD -Discuss different methods to quit smoking, the health benefits of quitting smoking, and the definition of COPD.   Nutrition I: Fats -Discuss the types of cholesterol, what cholesterol does to the heart, and how cholesterol levels can be controlled.   Nutrition II: Labels -Discuss the different components of food labels and how to read food label   Heart Parts/Heart Disease and PAD -Discuss the anatomy of the heart, the pathway of blood circulation through the heart, and these are affected by heart disease.   Stress I: Signs and Symptoms -Discuss the causes of stress, how stress may lead to anxiety and depression, and ways to limit stress.   Stress II: Relaxation -Discuss different types of relaxation techniques to limit stress.   Warning Signs of Stroke / TIA -Discuss definition of a stroke, what the signs and symptoms are of a stroke, and how to identify when someone is having stroke.   Knowledge Questionnaire Score:  Knowledge Questionnaire Score - 11/17/22 0942       Knowledge Questionnaire Score   Pre Score 23/28             Core Components/Risk Factors/Patient Goals at Admission:  Personal Goals and Risk Factors at Admission - 11/17/22 0942       Core Components/Risk Factors/Patient Goals on Admission    Weight Management Yes;Weight Loss    Intervention Weight Management: Develop a combined nutrition and exercise program designed to reach desired caloric intake, while maintaining appropriate intake of nutrient and fiber, sodium and fats, and appropriate energy expenditure required for the weight goal.;Weight Management: Provide education and appropriate resources to help participant work on and attain dietary goals.    Admit Weight 159 lb 3.2 oz (72.2 kg)    Goal Weight: Short Term 155 lb (70.3 kg)    Goal Weight: Long Term 155 lb (70.3 kg)    Expected Outcomes Short Term: Continue to assess and modify interventions until short term weight is  achieved;Long Term: Adherence to nutrition and physical activity/exercise program aimed toward attainment of established weight goal;Weight Loss: Understanding of general recommendations for a balanced deficit meal plan, which promotes 1-2 lb weight loss per week and includes a negative energy balance of [phone] kcal/d;Understanding recommendations for meals to include 15-35% energy as protein, 25-35% energy from fat, 35-60% energy from carbohydrates, less than 200mg  of dietary cholesterol, 20-35 gm of total fiber daily;Understanding of distribution of calorie intake throughout the day with the consumption of 4-5 meals/snacks;Weight Maintenance: Understanding of the daily nutrition guidelines, which includes 25-35% calories from fat, 7% or less cal from saturated fats, less than 200mg  cholesterol, less than 1.5gm of sodium, & 5 or more servings of fruits and vegetables daily    Tobacco Cessation Yes    Number of packs per day 1/2 pack per day    Intervention Assist the participant in steps to quit.  Provide individualized education and counseling about committing to Tobacco Cessation, relapse prevention, and pharmacological support that can be provided by physician.;Education officer, environmental, assist with locating and accessing local/national Quit Smoking programs, and support quit date choice.    Expected Outcomes Short Term: Will demonstrate readiness to quit, by selecting a quit date.;Short Term: Will quit all tobacco product use, adhering to prevention of relapse plan.;Long Term: Complete abstinence from all tobacco products for at least 12 months from quit date.    Diabetes Yes    Intervention Provide education about signs/symptoms and action to take for hypo/hyperglycemia.;Provide education about proper nutrition, including hydration, and aerobic/resistive exercise prescription along with prescribed medications to achieve blood glucose in normal ranges: Fasting glucose 65-99 mg/dL    Expected  Outcomes Short Term: Participant verbalizes understanding of the signs/symptoms and immediate care of hyper/hypoglycemia, proper foot care and importance of medication, aerobic/resistive exercise and nutrition plan for blood glucose control.;Long Term: Attainment of HbA1C < 7%.    Hypertension Yes    Intervention Provide education on lifestyle modifcations including regular physical activity/exercise, weight management, moderate sodium restriction and increased consumption of fresh fruit, vegetables, and low fat dairy, alcohol moderation, and smoking cessation.;Monitor prescription use compliance.    Expected Outcomes Short Term: Continued assessment and intervention until BP is < 140/12mm HG in hypertensive participants. < 130/46mm HG in hypertensive participants with diabetes, heart failure or chronic kidney disease.;Long Term: Maintenance of blood pressure at goal levels.    Lipids Yes    Intervention Provide education and support for participant on nutrition & aerobic/resistive exercise along with prescribed medications to achieve LDL 70mg , HDL >40mg .    Expected Outcomes Short Term: Participant states understanding of desired cholesterol values and is compliant with medications prescribed. Participant is following exercise prescription and nutrition guidelines.;Long Term: Cholesterol controlled with medications as prescribed, with individualized exercise RX and with personalized nutrition plan. Value goals: LDL < 70mg , HDL > 40 mg.             Core Components/Risk Factors/Patient Goals Review:   Goals and Risk Factor Review     Row Name 11/17/22 0943             Core Components/Risk Factors/Patient Goals Review   Personal Goals Review Tobacco Cessation       Review Enrique is a current tobacco user. Intervention for tobacco cessation was provided at the initial medical review. She was asked about readiness to quit and reported she is trying to reduce her smoking. Patient was advised and  educated about tobacco cessation using combination therapy, tobacco cessation classes, quit line, and quit smoking apps. Patient demonstrated understanding of this material. Staff will continue to provide encouragement and follow up with the patient throughout the program.       Expected Outcomes Short: Try patches with gum/lonzenge Long: Set potential quit date                Core Components/Risk Factors/Patient Goals at Discharge (Final Review):   Goals and Risk Factor Review - 11/17/22 0943       Core Components/Risk Factors/Patient Goals Review   Personal Goals Review Tobacco Cessation    Review Adilyn is a current tobacco user. Intervention for tobacco cessation was provided at the initial medical review. She was asked about readiness to quit and reported she is trying to reduce her smoking. Patient was advised and educated about tobacco cessation using combination therapy, tobacco cessation classes, quit line, and quit smoking apps. Patient demonstrated  understanding of this material. Staff will continue to provide encouragement and follow up with the patient throughout the program.    Expected Outcomes Short: Try patches with gum/lonzenge Long: Set potential quit date             ITP Comments:  ITP Comments     Row Name 11/16/22 1112 11/17/22 0927         ITP Comments Virtual orientation completed.  Diagnosis documentation found in [facility] 11-05-22 .  Orientation schedule 11-17-22 8am. Patient attend orientation today.  Patient is attendingCardiac Rehabilitation Program.  Documentation for diagnosis can be found in [facility] 11/03/22.  Reviewed medical chart, RPE/RPD, gym safety, and program guidelines.  Patient was fitted to equipment they will be using during rehab.  Patient is scheduled to start exercise on 11/18/22 at 1100.   Initial ITP created and sent for review and signature by Dr. Dina Rich, Medical Director for Cardiac Rehabilitation Program.               Comments:  Initial ITP

## 2022-11-18 ENCOUNTER — Encounter (HOSPITAL_COMMUNITY)
Admission: RE | Admit: 2022-11-18 | Discharge: 2022-11-18 | Disposition: A | Discharge status: Home / Self Care | Payer: Medicaid Other | Source: Ambulatory Visit | Attending: Cardiology | Admitting: Cardiology

## 2022-11-18 ENCOUNTER — Encounter (HOSPITAL_COMMUNITY): Payer: Self-pay | Admitting: *Deleted

## 2022-11-18 DIAGNOSIS — I214 Non-ST elevation (NSTEMI) myocardial infarction: Secondary | ICD-10-CM | POA: Diagnosis not present

## 2022-11-18 DIAGNOSIS — Z955 Presence of coronary angioplasty implant and graft: Secondary | ICD-10-CM

## 2022-11-18 LAB — GLUCOSE, CAPILLARY
Glucose-Capillary: 86 mg/dL (ref 70–99)
Glucose-Capillary: 118 mg/dL — ABNORMAL HIGH (ref 70–99)

## 2022-11-18 NOTE — Progress Notes (Addendum)
Daily Session Note  Patient Details  Name: [name] MRN: [id number] Date of Birth: 04/06/1961 Referring Provider:   Flowsheet Row CARDIAC REHAB PHASE II ORIENTATION from 11/17/2022 in [facility]  Referring Provider Ramiro Harvest MD  [Attending Cardiologist: Dr. Dina Rich       Encounter Date: 11/18/2022  Check In:  Session Check In - 11/18/22 1025       Check-In   Supervising physician immediately available to respond to emergencies See telemetry face sheet for immediately available ER MD    Location [facility]    Staff Present Ross Ludwig, BS, Exercise Physiologist;Jessica Juanetta Gosling, MA, RCEP, CCRP, Dow Adolph, RN, Engineer, mining, RN    Virtual Visit No    Medication changes reported     No    Fall or balance concerns reported    No    Tobacco Cessation No Change    Current number of cigarettes/nicotine per day     5    Warm-up and Cool-down Performed on first and last piece of equipment    Resistance Training Performed Yes    VAD Patient? No    PAD/SET Patient? No      Pain Assessment   Currently in Pain? No/denies    Pain Score 0-No pain    Multiple Pain Sites No             Capillary Blood Glucose: Results for orders placed or performed during the hospital encounter of 11/18/22 (from the past 24 hour(s))  Glucose, capillary     Status: None   Collection Time: 11/18/22 11:06 AM  Result Value Ref Range   Glucose-Capillary 86 70 - 99 mg/dL      Social History   Tobacco Use  Smoking Status Every Day   Current packs/day: 0.50   Types: Cigarettes   Passive exposure: Never  Smokeless Tobacco Never    Goals Met:  Independence with exercise equipment Exercise tolerated well No report of concerns or symptoms today Strength training completed today  Goals Unmet:  Not Applicable  Comments: Pt able to follow exercise prescription today without complaint.  Will continue to monitor for  progression.    Dr. Dina Rich is Medical Director for [facility]

## 2022-11-18 NOTE — Progress Notes (Signed)
Cardiac Individual Treatment Plan  Patient Details  Name: [name] MRN: [id number] Date of Birth: 10-22-1960 Referring Provider:   Flowsheet Row CARDIAC REHAB PHASE II ORIENTATION from 11/17/2022 in [facility] CARDIAC REHABILITATION  Referring Provider Ramiro Harvest MD  [Attending Cardiologist: Dr. Dina Rich       Initial Encounter Date:  Flowsheet Row CARDIAC REHAB PHASE II ORIENTATION from 11/17/2022 in [place] CARDIAC REHABILITATION  Date 11/17/22       Visit Diagnosis: NSTEMI (non-ST elevated myocardial infarction) [facility])  Status post coronary artery stent placement  Patient's Home Medications on Admission:  Current Outpatient Medications:    aspirin EC 81 MG tablet, Take 1 tablet (81 mg total) by mouth daily. Swallow whole., Disp: 120 tablet, Rfl: 0   atorvastatin (LIPITOR) 40 MG tablet, Take 1 tablet (40 mg total) by mouth every evening., Disp: 90 tablet, Rfl: 0   Calcium Carbonate Antacid (ANTACID PO), Take 1 tablet by mouth daily., Disp: , Rfl:    cetirizine (ZYRTEC) 10 MG tablet, Take 10 mg by mouth daily., Disp: , Rfl:    clopidogrel (PLAVIX) 75 MG tablet, Take 1 tablet (75 mg total) by mouth daily., Disp: 90 tablet, Rfl: 0   desonide (DESOWEN) 0.05 % cream, Use twice daily for flare ups on face, maximum 10 days., Disp: 60 g, Rfl: 5   diphenhydrAMINE (BENADRYL ALLERGY) 25 MG tablet, Take 50 mg by mouth 2 (two) times daily. PRN, Disp: , Rfl:    EPINEPHrine 0.3 mg/0.3 mL IJ SOAJ injection, Inject into the muscle., Disp: , Rfl:    hydrochlorothiazide (HYDRODIURIL) 12.5 MG tablet, Take 12.5 mg by mouth daily., Disp: , Rfl:    hydrOXYzine (ATARAX) 25 MG tablet, Take 25 mg by mouth 4 (four) times daily as needed., Disp: , Rfl:    KRILL OIL PO, Take 2 tablets by mouth 2 (two) times daily., Disp: , Rfl:    metFORMIN (GLUCOPHAGE) 1000 MG tablet, Take 1,000 mg by mouth 2 (two) times daily., Disp: , Rfl:    metoprolol succinate (TOPROL-XL) 25 MG 24 hr tablet, Take  0.5 tablets (12.5 mg total) by mouth daily., Disp: 60 tablet, Rfl: 0   Multiple Vitamin (MULTIVITAMIN ADULT PO), Take 1 tablet by mouth., Disp: , Rfl:    nitroGLYCERIN (NITROSTAT) 0.4 MG SL tablet, Place 1 tablet (0.4 mg total) under the tongue every 5 (five) minutes x 3 doses as needed for chest pain., Disp: 25 tablet, Rfl: 0   psyllium (REGULOID) 0.52 g capsule, Take 0.52 g by mouth daily., Disp: , Rfl:    traZODone (DESYREL) 100 MG tablet, Take 50-100 mg by mouth at bedtime as needed., Disp: , Rfl:    triamcinolone ointment (KENALOG) 0.1 %, Apply twice daily for flare ups below neck, maximum 10 days., Disp: 453.6 g, Rfl: 1  Past Medical History: Past Medical History:  Diagnosis Date   Diabetes mellitus without complication (HCC)    Hypertension     Tobacco Use: Social History   Tobacco Use  Smoking Status Every Day   Current packs/day: 0.50   Types: Cigarettes   Passive exposure: Never  Smokeless Tobacco Never    Labs: Review Flowsheet       Latest Ref Rng & Units 11/03/2022 11/04/2022  Labs for ITP Cardiac and Pulmonary Rehab  Cholestrol 0 - 200 mg/dL - 914   LDL (calc) 0 - 99 mg/dL - 782   HDL-C >95 mg/dL - 44   Trlycerides <621 mg/dL - 308   Hemoglobin M5H  4.8 - 5.6 % 6.2  -    Details            Capillary Blood Glucose: Lab Results  Component Value Date   GLUCAP 70 11/05/2022   GLUCAP 116 (H) 11/05/2022   GLUCAP 115 (H) 11/04/2022   GLUCAP 105 (H) 11/04/2022   GLUCAP 111 (H) 11/04/2022     Exercise Target Goals: Exercise Program Goal: Individual exercise prescription set using results from initial 6 min walk test and THRR while considering  patient's activity barriers and safety.   Exercise Prescription Goal: Starting with aerobic activity 30 plus minutes a day, 3 days per week for initial exercise prescription. Provide home exercise prescription and guidelines that participant acknowledges understanding prior to discharge.  Activity Barriers & Risk  Stratification:  Activity Barriers & Cardiac Risk Stratification - 11/17/22 0801       Activity Barriers & Cardiac Risk Stratification   Activity Barriers Joint Problems;Deconditioning;Muscular Weakness   previous ankle injury, L shoulder impingement   Cardiac Risk Stratification Moderate             6 Minute Walk:  6 Minute Walk     Row Name 11/17/22 0929         6 Minute Walk   Phase Initial     Distance 1300 feet     Walk Time 6 minutes     # of Rest Breaks 0     MPH 2.46     METS 3.59     RPE 11     VO2 Peak 12.57     Symptoms No     Resting HR 77 bpm     Resting BP 126/64     Resting Oxygen Saturation  96 %     Exercise Oxygen Saturation  during 6 min walk 97 %     Max Ex. HR 101 bpm     Max Ex. BP 134/72     2 Minute Post BP 124/62              Oxygen Initial Assessment:   Oxygen Re-Evaluation:   Oxygen Discharge (Final Oxygen Re-Evaluation):   Initial Exercise Prescription:  Initial Exercise Prescription - 11/17/22 0900       Date of Initial Exercise RX and Referring Provider   Date 11/17/22    Referring Provider Ramiro Harvest MD   Attending Cardiologist: Dr. Dina Rich   Expected Discharge Date 02/12/23   Medicaid     Oxygen   Maintain Oxygen Saturation 88% or higher      Treadmill   MPH 2.5    Grade 1    Minutes 15    METs 3.26      REL-XR   Level 4    Speed 50    Minutes 15    METs 3.2      Prescription Details   Frequency (times per week) 3    Duration Progress to 30 minutes of continuous aerobic without signs/symptoms of physical distress      Intensity   THRR 40-80% of Max Heartrate 105-142    Ratings of Perceived Exertion 11-13    Perceived Dyspnea 0-4      Progression   Progression Continue to progress workloads to maintain intensity without signs/symptoms of physical distress.      Resistance Training   Training Prescription Yes    Weight 4 lb    Reps 10-15             Perform Capillary Blood  Glucose checks as needed.  Exercise Prescription Changes:   Exercise Prescription Changes     Row Name 11/17/22 0900             Response to Exercise   Blood Pressure (Admit) 126/64       Blood Pressure (Exercise) 134/72       Blood Pressure (Exit) 124/64       Heart Rate (Admit) 77 bpm       Heart Rate (Exercise) 101 bpm       Heart Rate (Exit) 69 bpm       Oxygen Saturation (Admit) 96 %       Oxygen Saturation (Exercise) 97 %       Rating of Perceived Exertion (Exercise) 11       Symptoms none       Comments walk test results                Exercise Comments:   Exercise Goals and Review:   Exercise Goals     Row Name 11/17/22 0932             Exercise Goals   Increase Physical Activity Yes       Intervention Provide advice, education, support and counseling about physical activity/exercise needs.;Develop an individualized exercise prescription for aerobic and resistive training based on initial evaluation findings, risk stratification, comorbidities and participant's personal goals.       Expected Outcomes Short Term: Attend rehab on a regular basis to increase amount of physical activity.;Long Term: Add in home exercise to make exercise part of routine and to increase amount of physical activity.;Long Term: Exercising regularly at least 3-5 days a week.       Increase Strength and Stamina Yes       Intervention Provide advice, education, support and counseling about physical activity/exercise needs.;Develop an individualized exercise prescription for aerobic and resistive training based on initial evaluation findings, risk stratification, comorbidities and participant's personal goals.       Expected Outcomes Short Term: Increase workloads from initial exercise prescription for resistance, speed, and METs.;Short Term: Perform resistance training exercises routinely during rehab and add in resistance training at home;Long Term: Improve cardiorespiratory fitness,  muscular endurance and strength as measured by increased METs and functional capacity ( )       Able to understand and use rate of perceived exertion (RPE) scale Yes       Intervention Provide education and explanation on how to use RPE scale       Expected Outcomes Short Term: Able to use RPE daily in rehab to express subjective intensity level;Long Term:  Able to use RPE to guide intensity level when exercising independently       Able to understand and use Dyspnea scale Yes       Intervention Provide education and explanation on how to use Dyspnea scale       Expected Outcomes Short Term: Able to use Dyspnea scale daily in rehab to express subjective sense of shortness of breath during exertion;Long Term: Able to use Dyspnea scale to guide intensity level when exercising independently       Knowledge and understanding of Target Heart Rate Range (THRR) Yes       Intervention Provide education and explanation of THRR including how the numbers were predicted and where they are located for reference       Expected Outcomes Short Term: Able to state/look up THRR;Long Term: Able to use THRR to govern intensity when  exercising independently;Short Term: Able to use daily as guideline for intensity in rehab       Able to check pulse independently Yes       Intervention Provide education and demonstration on how to check pulse in carotid and radial arteries.;Review the importance of being able to check your own pulse for safety during independent exercise       Expected Outcomes Short Term: Able to explain why pulse checking is important during independent exercise;Long Term: Able to check pulse independently and accurately       Understanding of Exercise Prescription Yes       Intervention Provide education, explanation, and written materials on patient's individual exercise prescription       Expected Outcomes Short Term: Able to explain program exercise prescription;Long Term: Able to explain home  exercise prescription to exercise independently                Exercise Goals Re-Evaluation :  Exercise Goals Re-Evaluation     Row Name 11/17/22 0933             Exercise Goal Re-Evaluation   Exercise Goals Review Able to understand and use rate of perceived exertion (RPE) scale;Able to understand and use Dyspnea scale       Comments Reviewed RPE  and dyspnea scale, and program prescription with pt today.  Pt voiced understanding and was given a copy of goals to take home.       Expected Outcomes Short: Use RPE daily to regulate intensity.  Long: Follow program prescription                 Discharge Exercise Prescription (Final Exercise Prescription Changes):  Exercise Prescription Changes - 11/17/22 0900       Response to Exercise   Blood Pressure (Admit) 126/64    Blood Pressure (Exercise) 134/72    Blood Pressure (Exit) 124/64    Heart Rate (Admit) 77 bpm    Heart Rate (Exercise) 101 bpm    Heart Rate (Exit) 69 bpm    Oxygen Saturation (Admit) 96 %    Oxygen Saturation (Exercise) 97 %    Rating of Perceived Exertion (Exercise) 11    Symptoms none    Comments walk test results             Nutrition:  Target Goals: Understanding of nutrition guidelines, daily intake of sodium 1500mg , cholesterol 200mg , calories 30% from fat and 7% or less from saturated fats, daily to have 5 or more servings of fruits and vegetables.  Biometrics:  Pre Biometrics - 11/17/22 0933       Pre Biometrics   Height 5' 6.5" (1.689 m)    Weight 159 lb 3.2 oz (72.2 kg)    Waist Circumference 34.25 inches    Hip Circumference 38 inches    Waist to Hip Ratio 0.9 %    BMI (Calculated) 25.31    Grip Strength 30 kg    Single Leg Stand 30 seconds              Nutrition Therapy Plan and Nutrition Goals:  Nutrition Therapy & Goals - 11/17/22 0941       Intervention Plan   Intervention Prescribe, educate and counsel regarding individualized specific dietary  modifications aiming towards targeted core components such as weight, hypertension, lipid management, diabetes, heart failure and other comorbidities.;Nutrition handout(s) given to patient.    Expected Outcomes Short Term Goal: Understand basic principles of dietary content, such as calories, fat,  sodium, cholesterol and nutrients.;Long Term Goal: Adherence to prescribed nutrition plan.             Nutrition Assessments:  MEDIFICTS Score Key: ?70 Need to make dietary changes  40-70 Heart Healthy Diet ? 40 Therapeutic Level Cholesterol Diet  Flowsheet Row CARDIAC REHAB PHASE II ORIENTATION from 11/17/2022 in [facility] CARDIAC REHABILITATION  Picture Your Plate Total Score on Admission 68      Picture Your Plate Scores: <69 Unhealthy dietary pattern with much room for improvement. 41-50 Dietary pattern unlikely to meet recommendations for good health and room for improvement. 51-60 More healthful dietary pattern, with some room for improvement.  >60 Healthy dietary pattern, although there may be some specific behaviors that could be improved.    Nutrition Goals Re-Evaluation:   Nutrition Goals Discharge (Final Nutrition Goals Re-Evaluation):   Psychosocial: Target Goals: Acknowledge presence or absence of significant depression and/or stress, maximize coping skills, provide positive support system. Participant is able to verbalize types and ability to use techniques and skills needed for reducing stress and depression.  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  Using trazodone for sleep. Using smoking for anxiety.      Family Dynamics   Good Support System? Yes      Barriers   Psychosocial barriers to participate  in program The patient should benefit from training in stress management and relaxation.;Psychosocial barriers identified (see note)      Screening Interventions   Interventions Encouraged to exercise;To provide support and resources with identified psychosocial needs;Provide feedback about the scores to participant    Expected Outcomes Short Term goal: Utilizing psychosocial counselor, staff and physician to assist with identification of specific Stressors or current issues interfering with healing process. Setting desired goal for each stressor or current issue identified.;Long Term Goal: Stressors or current issues are controlled or eliminated.;Short Term goal: Identification and review with participant of any Quality of Life or Depression concerns found by scoring the questionnaire.;Long Term goal: The participant improves quality of Life and PHQ9 Scores as seen by post scores and/or verbalization of changes             Quality of Life Scores:  Quality of Life - 11/17/22 0941       Quality of Life   Select Quality of Life      Quality of Life Scores   Health/Function Pre 24.53 %    Socioeconomic Pre 27.5 %    Psych/Spiritual Pre 22.5 %    Family Pre 28.5 %    GLOBAL Pre 25.14 %            Scores of 19 and below usually indicate a poorer quality of life in these areas.  A difference of  2-3 points is a clinically meaningful difference.  A difference of 2-3 points in the total score of the Quality of Life Index has been associated with significant improvement in overall quality of life, self-image, physical symptoms, and general health in studies assessing change in quality of life.  PHQ-9: Review Flowsheet       11/17/2022  Depression screen PHQ 2/9  Decreased Interest 1  Down, Depressed, Hopeless 0  PHQ - 2 Score 1  Altered sleeping 0  Tired, decreased  energy 1  Change in appetite 3  Feeling bad or failure about yourself  1  Trouble concentrating 0  Moving slowly or  fidgety/restless 0  Suicidal thoughts 0  PHQ-9 Score 6  Difficult doing work/chores Somewhat difficult    Details           Interpretation of Total Score  Total Score Depression Severity:  1-4 = Minimal depression, 5-9 = Mild depression, 10-14 = Moderate depression, 15-19 = Moderately severe depression, 20-27 = Severe depression   Psychosocial Evaluation and Intervention:  Psychosocial Evaluation - 11/17/22 0934       Psychosocial Evaluation & Interventions   Interventions Stress management education;Relaxation education;Encouraged to exercise with the program and follow exercise prescription    Comments Seanna is coming into cardiac rehab after a NSTEMI and stent.  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  Prior to caring for her mother full time she was active and going to gym regularly.  She would like to get back there again soon.  She is a chronic bad sleeper and uses trazadone at night to help her calm her mind and go to sleep.  She could benefit from stress education and relaxation techniques.  Her other biggest stress is trying to quit smoking.  She has been a 1/2 ppd for many years.  She is down to 4-5 cigarettes a day and wants to continue to wean. She has talked about it with her doctor and was encouraged to try the patches and gum to help quit.  We also reviewed the quit smoking packet today.  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   She enjoys that a lot.  She is looking forward to program and getting moving again and back to gym.  She has no barriers to getting to rehab and is able to leave her mother to come to rehab.  She is on Medicaid and will have the 12 week limit.    Expected Outcomes Short: Attend rehab to get back to habit of exercise Long: Conitnue to work on quitting smoking             Psychosocial Re-Evaluation:   Psychosocial Discharge (Final Psychosocial  Re-Evaluation):   Vocational Rehabilitation: Provide vocational rehab assistance to qualifying candidates.   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            Education: Education Goals: Education classes will be provided on a weekly basis, covering required topics. Participant will state understanding/return demonstration of topics presented.  Learning Barriers/Preferences:  Learning Barriers/Preferences - 11/17/22 0802       Learning Barriers/Preferences   Learning Barriers Sight   glasses   Learning Preferences Written Material             Education Topics: Hypertension, Hypertension Reduction -Define heart disease and high blood pressure. Discus how high blood pressure affects the body and ways to reduce high blood pressure.   Exercise and Your Heart -Discuss why it is important to exercise, the FITT principles of exercise, normal and abnormal responses to exercise, and how to exercise safely.   Angina -Discuss definition of angina, causes of angina, treatment of angina, and how to decrease risk  of having angina.   Cardiac Medications -Review what the following cardiac medications are used for, how they affect the body, and side effects that may occur when taking the medications.  Medications include Aspirin, Beta blockers, calcium channel blockers, ACE Inhibitors, angiotensin receptor blockers, diuretics, digoxin, and antihyperlipidemics.   Congestive Heart Failure -Discuss the definition of CHF, how to live with CHF, the signs and symptoms of CHF, and how keep track of weight and sodium intake.   Heart Disease and Intimacy -Discus the effect sexual activity has on the heart, how changes occur during intimacy as we age, and safety during sexual activity.   Smoking  Cessation / COPD -Discuss different methods to quit smoking, the health benefits of quitting smoking, and the definition of COPD.   Nutrition I: Fats -Discuss the types of cholesterol, what cholesterol does to the heart, and how cholesterol levels can be controlled.   Nutrition II: Labels -Discuss the different components of food labels and how to read food label   Heart Parts/Heart Disease and PAD -Discuss the anatomy of the heart, the pathway of blood circulation through the heart, and these are affected by heart disease.   Stress I: Signs and Symptoms -Discuss the causes of stress, how stress may lead to anxiety and depression, and ways to limit stress.   Stress II: Relaxation -Discuss different types of relaxation techniques to limit stress.   Warning Signs of Stroke / TIA -Discuss definition of a stroke, what the signs and symptoms are of a stroke, and how to identify when someone is having stroke.   Knowledge Questionnaire Score:  Knowledge Questionnaire Score - 11/17/22 0942       Knowledge Questionnaire Score   Pre Score 23/28             Core Components/Risk Factors/Patient Goals at Admission:  Personal Goals and Risk Factors at Admission - 11/17/22 0942       Core Components/Risk Factors/Patient Goals on Admission    Weight Management Yes;Weight Loss    Intervention Weight Management: Develop a combined nutrition and exercise program designed to reach desired caloric intake, while maintaining appropriate intake of nutrient and fiber, sodium and fats, and appropriate energy expenditure required for the weight goal.;Weight Management: Provide education and appropriate resources to help participant work on and attain dietary goals.    Admit Weight 159 lb 3.2 oz (72.2 kg)    Goal Weight: Short Term 155 lb (70.3 kg)    Goal Weight: Long Term 155 lb (70.3 kg)    Expected Outcomes Short Term: Continue to assess and modify interventions until short term weight is  achieved;Long Term: Adherence to nutrition and physical activity/exercise program aimed toward attainment of established weight goal;Weight Loss: Understanding of general recommendations for a balanced deficit meal plan, which promotes 1-2 lb weight loss per week and includes a negative energy balance of [id number] kcal/d;Understanding recommendations for meals to include 15-35% energy as protein, 25-35% energy from fat, 35-60% energy from carbohydrates, less than 200mg  of dietary cholesterol, 20-35 gm of total fiber daily;Understanding of distribution of calorie intake throughout the day with the consumption of 4-5 meals/snacks;Weight Maintenance: Understanding of the daily nutrition guidelines, which includes 25-35% calories from fat, 7% or less cal from saturated fats, less than 200mg  cholesterol, less than 1.5gm of sodium, & 5 or more servings of fruits and vegetables daily    Tobacco Cessation Yes    Number of packs per day 1/2 pack per day    Intervention Assist the  participant in steps to quit. Provide individualized education and counseling about committing to Tobacco Cessation, relapse prevention, and pharmacological support that can be provided by physician.;Education officer, environmental, assist with locating and accessing local/national Quit Smoking programs, and support quit date choice.    Expected Outcomes Short Term: Will demonstrate readiness to quit, by selecting a quit date.;Short Term: Will quit all tobacco product use, adhering to prevention of relapse plan.;Long Term: Complete abstinence from all tobacco products for at least 12 months from quit date.    Diabetes Yes    Intervention Provide education about signs/symptoms and action to take for hypo/hyperglycemia.;Provide education about proper nutrition, including hydration, and aerobic/resistive exercise prescription along with prescribed medications to achieve blood glucose in normal ranges: Fasting glucose 65-99 mg/dL    Expected  Outcomes Short Term: Participant verbalizes understanding of the signs/symptoms and immediate care of hyper/hypoglycemia, proper foot care and importance of medication, aerobic/resistive exercise and nutrition plan for blood glucose control.;Long Term: Attainment of HbA1C < 7%.    Hypertension Yes    Intervention Provide education on lifestyle modifcations including regular physical activity/exercise, weight management, moderate sodium restriction and increased consumption of fresh fruit, vegetables, and low fat dairy, alcohol moderation, and smoking cessation.;Monitor prescription use compliance.    Expected Outcomes Short Term: Continued assessment and intervention until BP is < 140/80mm HG in hypertensive participants. < 130/83mm HG in hypertensive participants with diabetes, heart failure or chronic kidney disease.;Long Term: Maintenance of blood pressure at goal levels.    Lipids Yes    Intervention Provide education and support for participant on nutrition & aerobic/resistive exercise along with prescribed medications to achieve LDL 70mg , HDL >40mg .    Expected Outcomes Short Term: Participant states understanding of desired cholesterol values and is compliant with medications prescribed. Participant is following exercise prescription and nutrition guidelines.;Long Term: Cholesterol controlled with medications as prescribed, with individualized exercise RX and with personalized nutrition plan. Value goals: LDL < 70mg , HDL > 40 mg.             Core Components/Risk Factors/Patient Goals Review:   Goals and Risk Factor Review     Row Name 11/17/22 0943             Core Components/Risk Factors/Patient Goals Review   Personal Goals Review Tobacco Cessation       Review Jakeyla is a current tobacco user. Intervention for tobacco cessation was provided at the initial medical review. She was asked about readiness to quit and reported she is trying to reduce her smoking. Patient was advised and  educated about tobacco cessation using combination therapy, tobacco cessation classes, quit line, and quit smoking apps. Patient demonstrated understanding of this material. Staff will continue to provide encouragement and follow up with the patient throughout the program.       Expected Outcomes Short: Try patches with gum/lonzenge Long: Set potential quit date                Core Components/Risk Factors/Patient Goals at Discharge (Final Review):   Goals and Risk Factor Review - 11/17/22 0943       Core Components/Risk Factors/Patient Goals Review   Personal Goals Review Tobacco Cessation    Review Corrigan is a current tobacco user. Intervention for tobacco cessation was provided at the initial medical review. She was asked about readiness to quit and reported she is trying to reduce her smoking. Patient was advised and educated about tobacco cessation using combination therapy, tobacco cessation classes, quit line, and  quit smoking apps. Patient demonstrated understanding of this material. Staff will continue to provide encouragement and follow up with the patient throughout the program.    Expected Outcomes Short: Try patches with gum/lonzenge Long: Set potential quit date             ITP Comments:  ITP Comments     Row Name 11/16/22 1112 11/17/22 0927 11/18/22 0831       ITP Comments Virtual orientation completed.  Diagnosis documentation found in [facility] 11-05-22 .  Orientation schedule 11-17-22 8am. Patient attend orientation today.  Patient is attendingCardiac Rehabilitation Program.  Documentation for diagnosis can be found in [facility] 11/03/22.  Reviewed medical chart, RPE/RPD, gym safety, and program guidelines.  Patient was fitted to equipment they will be using during rehab.  Patient is scheduled to start exercise on 11/18/22 at 1100.   Initial ITP created and sent for review and signature by Dr. Dina Rich, Medical Director for Cardiac Rehabilitation Program. 30 day review completed.  ITP sent to Dr. Dina Rich, Medical Director of Cardiac Rehab. Continue with ITP unless changes are made by physician.  Only attended orinetaion thus far              Comments: 30 day review

## 2022-11-20 ENCOUNTER — Encounter (HOSPITAL_COMMUNITY): Admission: RE | Admit: 2022-11-20 | Payer: Medicaid Other | Source: Ambulatory Visit

## 2022-11-20 DIAGNOSIS — Z955 Presence of coronary angioplasty implant and graft: Secondary | ICD-10-CM

## 2022-11-20 DIAGNOSIS — I214 Non-ST elevation (NSTEMI) myocardial infarction: Secondary | ICD-10-CM | POA: Diagnosis not present

## 2022-11-20 LAB — GLUCOSE, CAPILLARY
Glucose-Capillary: 105 mg/dL — ABNORMAL HIGH (ref 70–99)
Glucose-Capillary: 89 mg/dL (ref 70–99)

## 2022-11-20 NOTE — Progress Notes (Signed)
Daily Session Note  Patient Details  Name: MARCIEL BRASHEARS MRN: [id number] Date of Birth: 1961/02/16 Referring Provider:   Flowsheet Row CARDIAC REHAB PHASE II ORIENTATION from 11/17/2022 in [facility] CARDIAC REHABILITATION  Referring Provider Ramiro Harvest MD  [Attending Cardiologist: Dr. Dina Rich       Encounter Date: 11/20/2022  Check In:  Session Check In - 11/20/22 1045       Check-In   Supervising physician immediately available to respond to emergencies See telemetry face sheet for immediately available ER MD    Location [facility]    Staff Present Rodena Medin, RN, BSN;Heather Fredric Mare, BS, Exercise Physiologist;Daphyne Daphine Deutscher, RN, BSN    Virtual Visit No    Medication changes reported     No    Fall or balance concerns reported    No    Tobacco Cessation No Change    Warm-up and Cool-down Performed on first and last piece of equipment    Resistance Training Performed Yes    VAD Patient? No    PAD/SET Patient? No      Pain Assessment   Currently in Pain? No/denies    Pain Score 0-No pain    Multiple Pain Sites No             Capillary Blood Glucose: Results for orders placed or performed during the hospital encounter of 11/20/22 (from the past 24 hour(s))  Glucose, capillary     Status: Abnormal   Collection Time: 11/20/22 10:50 AM  Result Value Ref Range   Glucose-Capillary 105 (H) 70 - 99 mg/dL      Social History   Tobacco Use  Smoking Status Every Day   Current packs/day: 0.50   Types: Cigarettes   Passive exposure: Never  Smokeless Tobacco Never    Goals Met:  Independence with exercise equipment Exercise tolerated well No report of concerns or symptoms today Strength training completed today  Goals Unmet:  Not Applicable  Comments: Pt able to follow exercise prescription today without complaint.  Will continue to monitor for progression.    Dr. Dina Rich is Medical Director for [facility] Cardiac  Rehab

## 2022-11-23 ENCOUNTER — Encounter (HOSPITAL_COMMUNITY)
Admission: RE | Admit: 2022-11-23 | Discharge: 2022-11-23 | Disposition: A | Discharge status: Home / Self Care | Payer: Medicaid Other | Source: Ambulatory Visit | Attending: Cardiology | Admitting: Cardiology

## 2022-11-23 DIAGNOSIS — I214 Non-ST elevation (NSTEMI) myocardial infarction: Secondary | ICD-10-CM | POA: Diagnosis not present

## 2022-11-23 LAB — GLUCOSE, CAPILLARY
Glucose-Capillary: 94 mg/dL (ref 70–99)
Glucose-Capillary: 112 mg/dL — ABNORMAL HIGH (ref 70–99)

## 2022-11-25 ENCOUNTER — Encounter (HOSPITAL_COMMUNITY)
Admission: RE | Admit: 2022-11-25 | Discharge: 2022-11-25 | Disposition: A | Discharge status: Home / Self Care | Payer: Medicaid Other | Source: Ambulatory Visit | Attending: Cardiology | Admitting: Cardiology

## 2022-11-25 DIAGNOSIS — Z955 Presence of coronary angioplasty implant and graft: Secondary | ICD-10-CM

## 2022-11-25 DIAGNOSIS — I214 Non-ST elevation (NSTEMI) myocardial infarction: Secondary | ICD-10-CM

## 2022-11-25 NOTE — Progress Notes (Signed)
Daily Session Note  Patient Details  Name: [name] MRN: [id number] Date of Birth: May 15, 1960 Referring Provider:   Flowsheet Row CARDIAC REHAB PHASE II ORIENTATION from 11/17/2022 in [facility] CARDIAC REHABILITATION  Referring Provider Ramiro Harvest MD  [Attending Cardiologist: Dr. Dina Rich       Encounter Date: 11/25/2022  Check In:  Session Check In - 11/25/22 1020       Check-In   Supervising physician immediately available to respond to emergencies See telemetry face sheet for immediately available MD    Location [facility]    Staff Present Ross Ludwig, BS, Exercise Physiologist;Jessica Juanetta Gosling, MA, RCEP, CCRP, Harolyn Rutherford, RN, BSN;Hillary Troutman BSN, RN    Virtual Visit No    Medication changes reported     No    Fall or balance concerns reported    No    Tobacco Cessation No Change    Warm-up and Cool-down Performed on first and last piece of equipment    Resistance Training Performed Yes    VAD Patient? No      Pain Assessment   Currently in Pain? No/denies             Capillary Blood Glucose: No results found for this or any previous visit (from the past 24 hour(s)).    Social History   Tobacco Use  Smoking Status Every Day   Current packs/day: 0.50   Types: Cigarettes   Passive exposure: Never  Smokeless Tobacco Never    Goals Met:  Independence with exercise equipment Exercise tolerated well No report of concerns or symptoms today Strength training completed today  Goals Unmet:  Not Applicable  Comments: Pt able to follow exercise prescription today without complaint.  Will continue to monitor for progression.    Dr. Dina Rich is Medical Director for [facility] Cardiac Rehab

## 2022-11-27 ENCOUNTER — Encounter (HOSPITAL_COMMUNITY)
Admission: RE | Admit: 2022-11-27 | Discharge: 2022-11-27 | Disposition: A | Discharge status: Home / Self Care | Payer: Medicaid Other | Source: Ambulatory Visit | Attending: Cardiology | Admitting: Cardiology

## 2022-11-27 DIAGNOSIS — I214 Non-ST elevation (NSTEMI) myocardial infarction: Secondary | ICD-10-CM | POA: Diagnosis not present

## 2022-11-27 DIAGNOSIS — Z955 Presence of coronary angioplasty implant and graft: Secondary | ICD-10-CM

## 2022-11-27 NOTE — Progress Notes (Signed)
Reviewed home exercise with pt today.  Pt plans to walk and use treadmill and rower at home for exercise.  She also belongs to [employer] and wants to go back.  Reviewed THR, pulse, RPE, sign and symptoms, pulse oximetery and when to call 911 or MD.  Also discussed weather considerations and indoor options.  Pt voiced understanding.

## 2022-11-27 NOTE — Progress Notes (Signed)
Daily Session Note  Patient Details  Name: [name] MRN: [id number] Date of Birth: Jun 12, 1960 Referring Provider:   Flowsheet Row CARDIAC REHAB PHASE II ORIENTATION from 11/17/2022 in [facility] CARDIAC REHABILITATION  Referring Provider Ramiro Harvest MD  [Attending Cardiologist: Dr. Dina Rich       Encounter Date: 11/27/2022  Check In:  Session Check In - 11/27/22 1045       Check-In   Supervising physician immediately available to respond to emergencies See telemetry face sheet for immediately available ER MD    Staff Present Ross Ludwig, BS, Exercise Physiologist;Jadelynn Boylan Laural Benes, RN, BSN    Virtual Visit No    Medication changes reported     No    Fall or balance concerns reported    No    Tobacco Cessation No Change    Current number of cigarettes/nicotine per day     5    Warm-up and Cool-down Performed on first and last piece of equipment    Resistance Training Performed Yes    VAD Patient? No    PAD/SET Patient? No      Pain Assessment   Currently in Pain? No/denies    Pain Score 0-No pain    Multiple Pain Sites No             Capillary Blood Glucose: No results found for this or any previous visit (from the past 24 hour(s)).    Social History   Tobacco Use  Smoking Status Every Day   Current packs/day: 0.50   Types: Cigarettes   Passive exposure: Never  Smokeless Tobacco Never    Goals Met:  Independence with exercise equipment Exercise tolerated well No report of concerns or symptoms today Strength training completed today  Goals Unmet:  Not Applicable  Comments: Pt able to follow exercise prescription today without complaint.  Will continue to monitor for progression.    Dr. Dina Rich is Medical Director for [facility] Cardiac Rehab

## 2022-11-30 ENCOUNTER — Encounter (HOSPITAL_COMMUNITY)
Admission: RE | Admit: 2022-11-30 | Discharge: 2022-11-30 | Disposition: A | Discharge status: Home / Self Care | Payer: Medicaid Other | Source: Ambulatory Visit | Attending: Cardiology | Admitting: Cardiology

## 2022-11-30 DIAGNOSIS — I214 Non-ST elevation (NSTEMI) myocardial infarction: Secondary | ICD-10-CM | POA: Diagnosis not present

## 2022-11-30 DIAGNOSIS — Z955 Presence of coronary angioplasty implant and graft: Secondary | ICD-10-CM

## 2022-11-30 NOTE — Progress Notes (Signed)
Daily Session Note  Patient Details  Name: [name] MRN: [id number] Date of Birth: 1960/08/17 Referring Provider:   Flowsheet Row CARDIAC REHAB PHASE II ORIENTATION from 11/17/2022 in [facility] CARDIAC REHABILITATION  Referring Provider Ramiro Harvest MD  [Attending Cardiologist: Dr. Dina Rich       Encounter Date: 11/30/2022  Check In:  Session Check In - 11/30/22 1045       Check-In   Supervising physician immediately available to respond to emergencies See telemetry face sheet for immediately available ER MD    Location [facility]    Staff Present Rodena Medin, RN, BSN;Jessica Juanetta Gosling, MA, RCEP, CCRP, CCET;Heather Fredric Mare, [place], Exercise Physiologist    Virtual Visit No    Medication changes reported     No    Fall or balance concerns reported    No    Tobacco Cessation No Change    Current number of cigarettes/nicotine per day     5    Warm-up and Cool-down Performed on first and last piece of equipment    Resistance Training Performed Yes    VAD Patient? No    PAD/SET Patient? No      Pain Assessment   Currently in Pain? No/denies    Pain Score 0-No pain    Multiple Pain Sites No             Capillary Blood Glucose: No results found for this or any previous visit (from the past 24 hour(s)).    Social History   Tobacco Use  Smoking Status Every Day   Current packs/day: 0.50   Types: Cigarettes   Passive exposure: Never  Smokeless Tobacco Never    Goals Met:  Independence with exercise equipment Exercise tolerated well No report of concerns or symptoms today Strength training completed today  Goals Unmet:  Not Applicable  Comments: Pt able to follow exercise prescription today without complaint.  Will continue to monitor for progression.    Dr. Dina Rich is Medical Director for [facility] Cardiac Rehab

## 2022-12-02 ENCOUNTER — Encounter (HOSPITAL_COMMUNITY)
Admission: RE | Admit: 2022-12-02 | Discharge: 2022-12-02 | Disposition: A | Discharge status: Home / Self Care | Payer: Medicaid Other | Source: Ambulatory Visit | Attending: Cardiology | Admitting: Cardiology

## 2022-12-02 DIAGNOSIS — Z955 Presence of coronary angioplasty implant and graft: Secondary | ICD-10-CM

## 2022-12-02 DIAGNOSIS — I214 Non-ST elevation (NSTEMI) myocardial infarction: Secondary | ICD-10-CM | POA: Diagnosis not present

## 2022-12-02 NOTE — Progress Notes (Signed)
Daily Session Note  Patient Details  Name: [name] MRN: [id number] Date of Birth: December 13, 1960 Referring Provider:   Flowsheet Row CARDIAC REHAB PHASE II ORIENTATION from 11/17/2022 in [facility] CARDIAC REHABILITATION  Referring Provider Ramiro Harvest MD  [Attending Cardiologist: Dr. Dina Rich       Encounter Date: 12/02/2022  Check In:  Session Check In - 12/02/22 1020       Check-In   Supervising physician immediately available to respond to emergencies See telemetry face sheet for immediately available MD    Location [facility]    Staff Present Ross Ludwig, BS, Exercise Physiologist;Neil Errickson Spencer BSN, RN;Jessica [place], MA, RCEP, CCRP, Dow Adolph, RN, BSN    Virtual Visit No    Medication changes reported     No    Fall or balance concerns reported    No    Tobacco Cessation No Change    Warm-up and Cool-down Performed on first and last piece of equipment    Resistance Training Performed Yes    VAD Patient? No    PAD/SET Patient? No      Pain Assessment   Currently in Pain? No/denies    Pain Score 0-No pain    Multiple Pain Sites No             Capillary Blood Glucose: No results found for this or any previous visit (from the past 24 hour(s)).    Social History   Tobacco Use  Smoking Status Every Day   Current packs/day: 0.50   Types: Cigarettes   Passive exposure: Never  Smokeless Tobacco Never    Goals Met:  Independence with exercise equipment Exercise tolerated well No report of concerns or symptoms today Strength training completed today  Goals Unmet:  Not Applicable  Comments: Marland KitchenMarland KitchenPt able to follow exercise prescription today without complaint.  Will continue to monitor for progression.    Dr. Dina Rich is Medical Director for [facility] Cardiac Rehab

## 2022-12-04 ENCOUNTER — Encounter (HOSPITAL_COMMUNITY)
Admission: RE | Admit: 2022-12-04 | Discharge: 2022-12-04 | Disposition: A | Discharge status: Home / Self Care | Payer: Medicaid Other | Source: Ambulatory Visit | Attending: Cardiology | Admitting: Cardiology

## 2022-12-04 DIAGNOSIS — Z955 Presence of coronary angioplasty implant and graft: Secondary | ICD-10-CM

## 2022-12-04 DIAGNOSIS — I214 Non-ST elevation (NSTEMI) myocardial infarction: Secondary | ICD-10-CM | POA: Diagnosis not present

## 2022-12-04 NOTE — Progress Notes (Signed)
Daily Session Note  Patient Details  Name: [name] MRN: [id number] Date of Birth: [birth year] Referring Provider:   Flowsheet Row CARDIAC REHAB PHASE II ORIENTATION from 11/17/2022 in [facility] CARDIAC REHABILITATION  Referring Provider Ramiro Harvest MD  [Attending Cardiologist: Dr. Dina Rich       Encounter Date: 12/04/2022  Check In:  Session Check In - 12/04/22 1118       Check-In   Supervising physician immediately available to respond to emergencies See telemetry face sheet for immediately available MD    Location [facility]Card[facility] Re[facility]    Staff Present Ross Ludwig, BS, Exercise Physiologist;Jessica Juanetta Gosling, MA, RCEP, CCRP, CCET    Staff Present Kary Kos, MS, Exercise Physiologist    Virtual Visit No    Medication changes reported     No    Fall or balance concerns reported    No    Tobacco Cessation No Change    Current number of cigarettes/nicotine per day     5    Warm-up and Cool-down Performed on first and last piece of equipment    Resistance Training Performed Yes    VAD Patient? No    PAD/SET Patient? No      Pain Assessment   Currently in Pain? Yes    Pain Score 6     Pain Location Foot    Pain Orientation Medial    Pain Descriptors / Indicators Aching    Pain Type Acute pain    Pain Radiating Towards arch of foot    Pain Onset Today    Pain Frequency Constant    Aggravating Factors  walking    Pain Relieving Factors pt has not taken anything for the pain this morning, resting helps    Effect of Pain on Daily Activities not able to walk a lot today    Multiple Pain Sites No             Capillary Blood Glucose: No results found for this or any previous visit (from the past 24 hour(s)).    Social History   Tobacco Use  Smoking Status Every Day   Current packs/day: 0.50   Types: Cigarettes   Passive exposure: Never  Smokeless Tobacco Never    Goals Met:  Independence with exercise equipment Exercise tolerated  well No report of concerns or symptoms today Strength training completed today  Goals Unmet:  Not Applicable  Comments: Pt able to follow exercise prescription today without complaint.  Will continue to monitor for progression.    Dr. Dina Rich is Medical Director for [facility] Cardiac Rehab

## 2022-12-09 ENCOUNTER — Encounter (HOSPITAL_COMMUNITY)
Admission: RE | Admit: 2022-12-09 | Discharge: 2022-12-09 | Disposition: A | Discharge status: Home / Self Care | Payer: Medicaid Other | Source: Ambulatory Visit | Attending: Cardiology | Admitting: Cardiology

## 2022-12-09 DIAGNOSIS — I214 Non-ST elevation (NSTEMI) myocardial infarction: Secondary | ICD-10-CM | POA: Insufficient documentation

## 2022-12-09 DIAGNOSIS — Z955 Presence of coronary angioplasty implant and graft: Secondary | ICD-10-CM | POA: Diagnosis present

## 2022-12-09 NOTE — Progress Notes (Signed)
Daily Session Note  Patient Details  Name: [name] MRN: [id number] Date of Birth: [birth year] Referring Provider:   Flowsheet Row CARDIAC REHAB PHASE II ORIENTATION from 11/17/2022 in [facility] CARDIAC REHABILITATION  Referring Provider Ramiro Harvest MD  [Attending Cardiologist: Dr. Dina Rich       Encounter Date: 12/09/2022  Check In:  Session Check In - 12/09/22 1148       Check-In   Supervising physician immediately available to respond to emergencies See telemetry face sheet for immediately available ER MD    Location [facility]    Staff Present Ross Ludwig, BS, Exercise Physiologist;Daisy Lites [place], MA, RCEP, CCRP, Dow Adolph, RN, BSN    Virtual Visit No    Medication changes reported     No    Fall or balance concerns reported    No    Tobacco Cessation No Change    Current number of cigarettes/nicotine per day     5    Warm-up and Cool-down Performed on first and last piece of equipment    Resistance Training Performed Yes    VAD Patient? No    PAD/SET Patient? No      Pain Assessment   Currently in Pain? No/denies             Capillary Blood Glucose: No results found for this or any previous visit (from the past 24 hour(s)).    Social History   Tobacco Use  Smoking Status Every Day   Current packs/day: 0.50   Types: Cigarettes   Passive exposure: Never  Smokeless Tobacco Never    Goals Met:  Independence with exercise equipment Exercise tolerated well No report of concerns or symptoms today Strength training completed today  Goals Unmet:  Not Applicable  Comments: Pt able to follow exercise prescription today without complaint.  Will continue to monitor for progression.   Dr. Dina Rich is Medical Director for [facility] Cardiac Rehab

## 2022-12-11 ENCOUNTER — Encounter (HOSPITAL_COMMUNITY)
Admission: RE | Admit: 2022-12-11 | Discharge: 2022-12-11 | Disposition: A | Discharge status: Home / Self Care | Payer: Medicaid Other | Source: Ambulatory Visit | Attending: Cardiology

## 2022-12-11 DIAGNOSIS — I214 Non-ST elevation (NSTEMI) myocardial infarction: Secondary | ICD-10-CM

## 2022-12-11 DIAGNOSIS — Z955 Presence of coronary angioplasty implant and graft: Secondary | ICD-10-CM

## 2022-12-11 NOTE — Progress Notes (Signed)
Daily Session Note  Patient Details  Name: [name] MRN: [id number] Date of Birth: [birth year] Referring Provider:   Flowsheet Row CARDIAC REHAB PHASE II ORIENTATION from 11/17/2022 in [facility] CARDIAC REHABILITATION  Referring Provider Ramiro Harvest MD  [Attending Cardiologist: Dr. Dina Rich       Encounter Date: 12/11/2022  Check In:  Session Check In - 12/11/22 1045       Check-In   Supervising physician immediately available to respond to emergencies See telemetry face sheet for immediately available ER MD    Location [facility]    Staff Present Rodena Medin, RN, Pleas Koch, RN, BSN;Jessica Hawkins, MA, RCEP, CCRP, CCET    Virtual Visit No    Medication changes reported     No    Fall or balance concerns reported    No    Tobacco Cessation No Change    Current number of cigarettes/nicotine per day     5    Resistance Training Performed Yes    VAD Patient? No    PAD/SET Patient? No      Pain Assessment   Currently in Pain? No/denies    Pain Score 0-No pain    Multiple Pain Sites No             Capillary Blood Glucose: No results found for this or any previous visit (from the past 24 hour(s)).    Social History   Tobacco Use  Smoking Status Every Day   Current packs/day: 0.50   Types: Cigarettes   Passive exposure: Never  Smokeless Tobacco Never    Goals Met:  Independence with exercise equipment Exercise tolerated well No report of concerns or symptoms today Strength training completed today  Goals Unmet:  Not Applicable  Comments: Pt able to follow exercise prescription today without complaint.  Will continue to monitor for progression.    Dr. Dina Rich is Medical Director for [facility] Cardiac Rehab

## 2022-12-14 ENCOUNTER — Encounter (HOSPITAL_COMMUNITY): Admission: RE | Admit: 2022-12-14 | Payer: Medicaid Other | Source: Ambulatory Visit

## 2022-12-16 ENCOUNTER — Encounter (HOSPITAL_COMMUNITY)
Admission: RE | Admit: 2022-12-16 | Discharge: 2022-12-16 | Disposition: A | Discharge status: Home / Self Care | Payer: Medicaid Other | Source: Ambulatory Visit | Attending: Cardiology | Admitting: Cardiology

## 2022-12-16 ENCOUNTER — Encounter (HOSPITAL_COMMUNITY): Payer: Self-pay | Admitting: *Deleted

## 2022-12-16 DIAGNOSIS — I214 Non-ST elevation (NSTEMI) myocardial infarction: Secondary | ICD-10-CM

## 2022-12-16 DIAGNOSIS — Z955 Presence of coronary angioplasty implant and graft: Secondary | ICD-10-CM

## 2022-12-16 NOTE — Progress Notes (Signed)
Daily Session Note  Patient Details  Name: [name] MRN: [id number] Date of Birth: 09/14/1960 Referring Provider:   Flowsheet Row CARDIAC REHAB PHASE II ORIENTATION from 11/17/2022 in [facility] CARDIAC REHABILITATION  Referring Provider Ramiro Harvest MD  [Attending Cardiologist: Dr. Dina Rich       Encounter Date: 12/16/2022  Check In:  Session Check In - 12/16/22 1030       Check-In   Supervising physician immediately available to respond to emergencies See telemetry face sheet for immediately available MD    Location [facility]    Staff Present Ross Ludwig, BS, Exercise Physiologist;Consuelo Suthers Daphine Deutscher, RN, BSN;Hillary Troutman BSN, RN;Jessica Downey, MA, RCEP, CCRP, CCET    Virtual Visit No    Medication changes reported     No    Fall or balance concerns reported    No    Tobacco Cessation No Change    Warm-up and Cool-down Performed on first and last piece of equipment    Resistance Training Performed Yes    VAD Patient? No      Pain Assessment   Currently in Pain? No/denies             Capillary Blood Glucose: No results found for this or any previous visit (from the past 24 hour(s)).    Social History   Tobacco Use  Smoking Status Every Day   Current packs/day: 0.50   Types: Cigarettes   Passive exposure: Never  Smokeless Tobacco Never    Goals Met:  Independence with exercise equipment Exercise tolerated well No report of concerns or symptoms today Strength training completed today  Goals Unmet:  Not Applicable  Comments: Pt able to follow exercise prescription today without complaint.  Will continue to monitor for progression.    Dr. Dina Rich is Medical Director for [facility] Cardiac Rehab

## 2022-12-16 NOTE — Progress Notes (Signed)
Cardiac Individual Treatment Plan  Patient Details  Name: [name] MRN: [id number] Date of Birth: Sep 03, 1960 Referring Provider:   Flowsheet Row CARDIAC REHAB PHASE II ORIENTATION from 11/17/2022 in [facility] CARDIAC REHABILITATION  Referring Provider Ramiro Harvest MD  [Attending Cardiologist: Dr. Dina Rich       Initial Encounter Date:  Flowsheet Row CARDIAC REHAB PHASE II ORIENTATION from 11/17/2022 in [place] CARDIAC REHABILITATION  Date 11/17/22       Visit Diagnosis: NSTEMI (non-ST elevated myocardial infarction) [facility])  Status post coronary artery stent placement  Patient's Home Medications on Admission:  Current Outpatient Medications:    aspirin EC 81 MG tablet, Take 1 tablet (81 mg total) by mouth daily. Swallow whole., Disp: 120 tablet, Rfl: 0   atorvastatin (LIPITOR) 40 MG tablet, Take 1 tablet (40 mg total) by mouth every evening., Disp: 90 tablet, Rfl: 0   Calcium Carbonate Antacid (ANTACID PO), Take 1 tablet by mouth daily., Disp: , Rfl:    cetirizine (ZYRTEC) 10 MG tablet, Take 10 mg by mouth daily., Disp: , Rfl:    clopidogrel (PLAVIX) 75 MG tablet, Take 1 tablet (75 mg total) by mouth daily., Disp: 90 tablet, Rfl: 0   desonide (DESOWEN) 0.05 % cream, Use twice daily for flare ups on face, maximum 10 days., Disp: 60 g, Rfl: 5   diphenhydrAMINE (BENADRYL ALLERGY) 25 MG tablet, Take 50 mg by mouth 2 (two) times daily. PRN, Disp: , Rfl:    EPINEPHrine 0.3 mg/0.3 mL IJ SOAJ injection, Inject into the muscle., Disp: , Rfl:    hydrochlorothiazide (HYDRODIURIL) 12.5 MG tablet, Take 12.5 mg by mouth daily., Disp: , Rfl:    hydrOXYzine (ATARAX) 25 MG tablet, Take 25 mg by mouth 4 (four) times daily as needed., Disp: , Rfl:    KRILL OIL PO, Take 2 tablets by mouth 2 (two) times daily., Disp: , Rfl:    metFORMIN (GLUCOPHAGE) 1000 MG tablet, Take 1,000 mg by mouth 2 (two) times daily., Disp: , Rfl:    metoprolol succinate (TOPROL-XL) 25 MG 24 hr tablet, Take  0.5 tablets (12.5 mg total) by mouth daily., Disp: 60 tablet, Rfl: 0   Multiple Vitamin (MULTIVITAMIN ADULT PO), Take 1 tablet by mouth., Disp: , Rfl:    nitroGLYCERIN (NITROSTAT) 0.4 MG SL tablet, Place 1 tablet (0.4 mg total) under the tongue every 5 (five) minutes x 3 doses as needed for chest pain., Disp: 25 tablet, Rfl: 0   psyllium (REGULOID) 0.52 g capsule, Take 0.52 g by mouth daily., Disp: , Rfl:    traZODone (DESYREL) 100 MG tablet, Take 50-100 mg by mouth at bedtime as needed., Disp: , Rfl:    triamcinolone ointment (KENALOG) 0.1 %, Apply twice daily for flare ups below neck, maximum 10 days., Disp: 453.6 g, Rfl: 1  Past Medical History: Past Medical History:  Diagnosis Date   Diabetes mellitus without complication (HCC)    Hypertension     Tobacco Use: Social History   Tobacco Use  Smoking Status Every Day   Current packs/day: 0.50   Types: Cigarettes   Passive exposure: Never  Smokeless Tobacco Never    Labs: Review Flowsheet       Latest Ref Rng & Units 11/03/2022 11/04/2022  Labs for ITP Cardiac and Pulmonary Rehab  Cholestrol 0 - 200 mg/dL - 578   LDL (calc) 0 - 99 mg/dL - 469   HDL-C >62 mg/dL - 44   Trlycerides <952 mg/dL - 841   Hemoglobin L2G  4.8 - 5.6 % 6.2  -    Details            Capillary Blood Glucose: Lab Results  Component Value Date   GLUCAP 112 (H) 11/23/2022   GLUCAP 94 11/23/2022   GLUCAP 89 11/20/2022   GLUCAP 105 (H) 11/20/2022   GLUCAP 118 (H) 11/18/2022     Exercise Target Goals: Exercise Program Goal: Individual exercise prescription set using results from initial 6 min walk test and THRR while considering  patient's activity barriers and safety.   Exercise Prescription Goal: Starting with aerobic activity 30 plus minutes a day, 3 days per week for initial exercise prescription. Provide home exercise prescription and guidelines that participant acknowledges understanding prior to discharge.  Activity Barriers & Risk  Stratification:  Activity Barriers & Cardiac Risk Stratification - 11/17/22 0801       Activity Barriers & Cardiac Risk Stratification   Activity Barriers Joint Problems;Deconditioning;Muscular Weakness   previous ankle injury, L shoulder impingement   Cardiac Risk Stratification Moderate             6 Minute Walk:  6 Minute Walk     Row Name 11/17/22 0929         6 Minute Walk   Phase Initial     Distance 1300 feet     Walk Time 6 minutes     # of Rest Breaks 0     MPH 2.46     METS 3.59     RPE 11     VO2 Peak 12.57     Symptoms No     Resting HR 77 bpm     Resting BP 126/64     Resting Oxygen Saturation  96 %     Exercise Oxygen Saturation  during 6 min walk 97 %     Max Ex. HR 101 bpm     Max Ex. BP 134/72     2 Minute Post BP 124/62              Oxygen Initial Assessment:   Oxygen Re-Evaluation:   Oxygen Discharge (Final Oxygen Re-Evaluation):   Initial Exercise Prescription:  Initial Exercise Prescription - 11/17/22 0900       Date of Initial Exercise RX and Referring Provider   Date 11/17/22    Referring Provider Ramiro Harvest MD   Attending Cardiologist: Dr. Dina Rich   Expected Discharge Date 02/12/23   Medicaid     Oxygen   Maintain Oxygen Saturation 88% or higher      Treadmill   MPH 2.5    Grade 1    Minutes 15    METs 3.26      REL-XR   Level 4    Speed 50    Minutes 15    METs 3.2      Prescription Details   Frequency (times per week) 3    Duration Progress to 30 minutes of continuous aerobic without signs/symptoms of physical distress      Intensity   THRR 40-80% of Max Heartrate 105-142    Ratings of Perceived Exertion 11-13    Perceived Dyspnea 0-4      Progression   Progression Continue to progress workloads to maintain intensity without signs/symptoms of physical distress.      Resistance Training   Training Prescription Yes    Weight 4 lb    Reps 10-15             Perform Capillary Blood  Glucose checks as needed.  Exercise Prescription Changes:   Exercise Prescription Changes     Row Name 11/17/22 0900 11/18/22 1200 11/27/22 1100 12/11/22 1500       Response to Exercise   Blood Pressure (Admit) 126/64 120/60 -- 124/60    Blood Pressure (Exercise) 134/72 130/60 -- --    Blood Pressure (Exit) 124/64 102/60 -- 114/62    Heart Rate (Admit) 77 bpm 79 bpm -- 71 bpm    Heart Rate (Exercise) 101 bpm 109 bpm -- 106 bpm    Heart Rate (Exit) 69 bpm 95 bpm -- 83 bpm    Oxygen Saturation (Admit) 96 % -- -- --    Oxygen Saturation (Exercise) 97 % -- -- --    Rating of Perceived Exertion (Exercise) 11 12 -- 12    Symptoms none -- -- --    Comments walk test results -- -- --    Duration -- Continue with 30 min of aerobic exercise without signs/symptoms of physical distress. -- Continue with 30 min of aerobic exercise without signs/symptoms of physical distress.    Intensity -- THRR unchanged -- THRR unchanged      Progression   Progression -- Continue to progress workloads to maintain intensity without signs/symptoms of physical distress. -- Continue to progress workloads to maintain intensity without signs/symptoms of physical distress.      Resistance Training   Training Prescription -- Yes -- Yes    Weight -- 3 -- 4    Reps -- 10-15 -- 10-15      Treadmill   MPH -- 2.7 -- 2.8    Grade -- 0 -- 1    Minutes -- 15 -- 15    METs -- 3.07 -- 3.53      REL-XR   Level -- 3 -- 3    Speed -- 74 -- 64    Minutes -- 15 -- 15    METs -- 5.9 -- 5.9      Home Exercise Plan   Plans to continue exercise at -- -- [employer] (comment)  Art[employer] (home: TM and rower) [employer] (comment)    Frequency -- -- Add 2 additional days to program exercise sessions. Add 2 additional days to program exercise sessions.    Initial Home Exercises Provided -- -- 11/27/22 --      Oxygen   Maintain Oxygen Saturation -- 88% or higher -- 88% or higher             Exercise  Comments:   Exercise Goals and Review:   Exercise Goals     Row Name 11/17/22 0932             Exercise Goals   Increase Physical Activity Yes       Intervention Provide advice, education, support and counseling about physical activity/exercise needs.;Develop an individualized exercise prescription for aerobic and resistive training based on initial evaluation findings, risk stratification, comorbidities and participant's personal goals.       Expected Outcomes Short Term: Attend rehab on a regular basis to increase amount of physical activity.;Long Term: Add in home exercise to make exercise part of routine and to increase amount of physical activity.;Long Term: Exercising regularly at least 3-5 days a week.       Increase Strength and Stamina Yes       Intervention Provide advice, education, support and counseling about physical activity/exercise needs.;Develop an individualized exercise prescription for aerobic and resistive training based on initial evaluation findings,  risk stratification, comorbidities and participant's personal goals.       Expected Outcomes Short Term: Increase workloads from initial exercise prescription for resistance, speed, and METs.;Short Term: Perform resistance training exercises routinely during rehab and add in resistance training at home;Long Term: Improve cardiorespiratory fitness, muscular endurance and strength as measured by increased METs and functional capacity ( )       Able to understand and use rate of perceived exertion (RPE) scale Yes       Intervention Provide education and explanation on how to use RPE scale       Expected Outcomes Short Term: Able to use RPE daily in rehab to express subjective intensity level;Long Term:  Able to use RPE to guide intensity level when exercising independently       Able to understand and use Dyspnea scale Yes       Intervention Provide education and explanation on how to use Dyspnea scale       Expected  Outcomes Short Term: Able to use Dyspnea scale daily in rehab to express subjective sense of shortness of breath during exertion;Long Term: Able to use Dyspnea scale to guide intensity level when exercising independently       Knowledge and understanding of Target Heart Rate Range (THRR) Yes       Intervention Provide education and explanation of THRR including how the numbers were predicted and where they are located for reference       Expected Outcomes Short Term: Able to state/look up THRR;Long Term: Able to use THRR to govern intensity when exercising independently;Short Term: Able to use daily as guideline for intensity in rehab       Able to check pulse independently Yes       Intervention Provide education and demonstration on how to check pulse in carotid and radial arteries.;Review the importance of being able to check your own pulse for safety during independent exercise       Expected Outcomes Short Term: Able to explain why pulse checking is important during independent exercise;Long Term: Able to check pulse independently and accurately       Understanding of Exercise Prescription Yes       Intervention Provide education, explanation, and written materials on patient's individual exercise prescription       Expected Outcomes Short Term: Able to explain program exercise prescription;Long Term: Able to explain home exercise prescription to exercise independently                Exercise Goals Re-Evaluation :  Exercise Goals Re-Evaluation     Row Name 11/17/22 0933 11/19/22 0917 11/27/22 1120 12/02/22 1117       Exercise Goal Re-Evaluation   Exercise Goals Review Able to understand and use rate of perceived exertion (RPE) scale;Able to understand and use Dyspnea scale Increase Physical Activity;Understanding of Exercise Prescription;Increase Strength and Stamina Increase Physical Activity;Increase Strength and Stamina;Able to understand and use rate of perceived exertion (RPE)  scale;Able to understand and use Dyspnea scale;Knowledge and understanding of Target Heart Rate Range (THRR);Able to check pulse independently;Understanding of Exercise Prescription Increase Physical Activity;Increase Strength and Stamina;Understanding of Exercise Prescription (P)     Comments Reviewed RPE  and dyspnea scale, and program prescription with pt today.  Pt voiced understanding and was given a copy of goals to take home. Lashara had her first session of cardiac rehab. She did great with her first ecercise and was working at an [employer] of 12 for both sets. Will continue to monitor  and progress as able Valois is doing well in rehab.  She is feeling good and steadily increasing her workloads.  She is eager to get back to gym as well.  Reviewed home exercise with pt today.  Pt plans to walk and use treadmill and rower at home for exercise.  She also belongs to [employer] and wants to go back.  Reviewed THR, pulse, RPE, sign and symptoms, pulse oximetery and when to call 911 or MD.  Also discussed weather considerations and indoor options.  Pt voiced understanding. Deshana (P)     Expected Outcomes Short: Use RPE daily to regulate intensity.  Long: Follow program prescription Short term: continue to working at her workload until RPE is 11 then increase worklaod   long term goal: continue to attend caridac rehab Short: Start to go back to gym Long: Continue to improve strength --              Discharge Exercise Prescription (Final Exercise Prescription Changes):  Exercise Prescription Changes - 12/11/22 1500       Response to Exercise   Blood Pressure (Admit) 124/60    Blood Pressure (Exit) 114/62    Heart Rate (Admit) 71 bpm    Heart Rate (Exercise) 106 bpm    Heart Rate (Exit) 83 bpm    Rating of Perceived Exertion (Exercise) 12    Duration Continue with 30 min of aerobic exercise without signs/symptoms of physical distress.    Intensity THRR unchanged      Progression   Progression  Continue to progress workloads to maintain intensity without signs/symptoms of physical distress.      Resistance Training   Training Prescription Yes    Weight 4    Reps 10-15      Treadmill   MPH 2.8    Grade 1    Minutes 15    METs 3.53      REL-XR   Level 3    Speed 64    Minutes 15    METs 5.9      Home Exercise Plan   Plans to continue exercise at [facility] (comment)    Frequency Add 2 additional days to program exercise sessions.      Oxygen   Maintain Oxygen Saturation 88% or higher             Nutrition:  Target Goals: Understanding of nutrition guidelines, daily intake of sodium 1500mg , cholesterol 200mg , calories 30% from fat and 7% or less from saturated fats, daily to have 5 or more servings of fruits and vegetables.  Biometrics:  Pre Biometrics - 11/17/22 0933       Pre Biometrics   Height 5' 6.5" (1.689 m)    Weight 159 lb 3.2 oz (72.2 kg)    Waist Circumference 34.25 inches    Hip Circumference 38 inches    Waist to Hip Ratio 0.9 %    BMI (Calculated) 25.31    Grip Strength 30 kg    Single Leg Stand 30 seconds              Nutrition Therapy Plan and Nutrition Goals:  Nutrition Therapy & Goals - 11/17/22 0941       Intervention Plan   Intervention Prescribe, educate and counsel regarding individualized specific dietary modifications aiming towards targeted core components such as weight, hypertension, lipid management, diabetes, heart failure and other comorbidities.;Nutrition handout(s) given to patient.    Expected Outcomes Short Term Goal: Understand basic principles of dietary  content, such as calories, fat, sodium, cholesterol and nutrients.;Long Term Goal: Adherence to prescribed nutrition plan.             Nutrition Assessments:  MEDIFICTS Score Key: >=70 Need to make dietary changes  40-70 Heart Healthy Diet <= 40 Therapeutic Level Cholesterol Diet  Flowsheet Row CARDIAC REHAB PHASE II ORIENTATION from  11/17/2022 in [facility] CARDIAC REHABILITATION  Picture Your Plate Total Score on Admission 68      Picture Your Plate Scores: <62 Unhealthy dietary pattern with much room for improvement. 41-50 Dietary pattern unlikely to meet recommendations for good health and room for improvement. 51-60 More healthful dietary pattern, with some room for improvement.  >60 Healthy dietary pattern, although there may be some specific behaviors that could be improved.    Nutrition Goals Re-Evaluation:  Nutrition Goals Re-Evaluation     Row Name 11/27/22 1123             Goals   Nutrition Goal Heart Healhty Diet       Comment Spring is doing well in rehab.  She has been trying to eat better and making better choices.  She has found that she now reaches for a piece of fruit when she wants a snack. She is trying to adopt changes but is limited by what her mom will also eat.  She feels that she has a good grasp on her diet and does limit her sodium intake as well.       Expected Outcome Short: continue to add in more fruit and vegetables Long: continue to focus on healthy eating                Nutrition Goals Discharge (Final Nutrition Goals Re-Evaluation):  Nutrition Goals Re-Evaluation - 11/27/22 1123       Goals   Nutrition Goal Heart Healhty Diet    Comment Kaeda is doing well in rehab.  She has been trying to eat better and making better choices.  She has found that she now reaches for a piece of fruit when she wants a snack. She is trying to adopt changes but is limited by what her mom will also eat.  She feels that she has a good grasp on her diet and does limit her sodium intake as well.    Expected Outcome Short: continue to add in more fruit and vegetables Long: continue to focus on healthy eating             Psychosocial: Target Goals: Acknowledge presence or absence of significant depression and/or stress, maximize coping skills, provide positive support system. Participant is  able to verbalize types and ability to use techniques and skills needed for reducing stress and depression.  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  Using trazodone for sleep. Using smoking for anxiety.      Family Dynamics   Good Support System? Yes      Barriers   Psychosocial barriers to participate in program The patient should benefit from training in stress management and relaxation.;Psychosocial barriers identified (see note)      Screening Interventions   Interventions Encouraged to exercise;To provide support and resources with identified psychosocial needs;Provide feedback about the scores to participant    Expected  Outcomes Short Term goal: Utilizing psychosocial counselor, staff and physician to assist with identification of specific Stressors or current issues interfering with healing process. Setting desired goal for each stressor or current issue identified.;Long Term Goal: Stressors or current issues are controlled or eliminated.;Short Term goal: Identification and review with participant of any Quality of Life or Depression concerns found by scoring the questionnaire.;Long Term goal: The participant improves quality of Life and PHQ9 Scores as seen by post scores and/or verbalization of changes             Quality of Life Scores:  Quality of Life - 11/17/22 0941       Quality of Life   Select Quality of Life      Quality of Life Scores   Health/Function Pre 24.53 %    Socioeconomic Pre 27.5 %    Psych/Spiritual Pre 22.5 %    Family Pre 28.5 %    GLOBAL Pre 25.14 %            Scores of 19 and below usually indicate a poorer quality of life in these areas.  A difference of  2-3 points is a clinically  meaningful difference.  A difference of 2-3 points in the total score of the Quality of Life Index has been associated with significant improvement in overall quality of life, self-image, physical symptoms, and general health in studies assessing change in quality of life.  PHQ-9: Review Flowsheet       11/17/2022  Depression screen PHQ 2/9  Decreased Interest 1  Down, Depressed, Hopeless 0  PHQ - 2 Score 1  Altered sleeping 0  Tired, decreased energy 1  Change in appetite 3  Feeling bad or failure about yourself  1  Trouble concentrating 0  Moving slowly or fidgety/restless 0  Suicidal thoughts 0  PHQ-9 Score 6  Difficult doing work/chores Somewhat difficult    Details           Interpretation of Total Score  Total Score Depression Severity:  1-4 = Minimal depression, 5-9 = Mild depression, 10-14 = Moderate depression, 15-19 = Moderately severe depression, 20-27 = Severe depression   Psychosocial Evaluation and Intervention:  Psychosocial Evaluation - 11/17/22 0934       Psychosocial Evaluation & Interventions   Interventions Stress management education;Relaxation education;Encouraged to exercise with the program and follow exercise prescription    Comments Carolynn is coming into cardiac rehab after a NSTEMI and stent.  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  Prior to caring for her mother full time she was active and going to gym regularly.  She would like to get back there again soon.  She is a chronic bad sleeper and uses trazadone at night to help her calm her mind and go to sleep.  She could benefit from stress education and relaxation techniques.  Her other biggest stress is trying to quit smoking.  She has been a 1/2 ppd for many years.  She is down to 4-5 cigarettes a day and wants to continue to wean. She has talked about it with her doctor and was encouraged to try the patches and gum to help quit.  We also  reviewed the quit smoking packet today.  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   She enjoys that a lot.  She is looking forward to program and getting moving again and  back to gym.  She has no barriers to getting to rehab and is able to leave her mother to come to rehab.  She is on Medicaid and will have the 12 week limit.    Expected Outcomes Short: Attend rehab to get back to habit of exercise Long: Conitnue to work on quitting smoking             Psychosocial Re-Evaluation:  Psychosocial Re-Evaluation     Row Name 11/27/22 1127             Psychosocial Re-Evaluation   Current issues with Current Stress Concerns       Comments Jenayah is doing well in rehab.  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  She and her mom butt heads multiple times a week which is very frustrating for her.  She has been able to get help to come in most mornings to give her a break and to allow her to go out to do errands and get to rehab/exercise.  She says having the help has made a big difference for her.  She is also working on quitting smoking, which has always been a source of stress relief. She is meeitng with her doctor next week for meds/resources.  We commended her for taking this next step in her health.  She is still sleeping well.       Expected Outcomes Short: Continue to work on quitting smoking Long: Continue to cope with caretaking and making time for self.       Interventions Encouraged to attend Pulmonary Rehabilitation for the exercise       Continue Psychosocial Services  Follow up required by staff                Psychosocial Discharge (Final Psychosocial Re-Evaluation):  Psychosocial Re-Evaluation - 11/27/22 1127       Psychosocial Re-Evaluation   Current issues with Current Stress Concerns    Comments Allana is doing well in rehab.  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  She and her mom butt  heads multiple times a week which is very frustrating for her.  She has been able to get help to come in most mornings to give her a break and to allow her to go out to do errands and get to rehab/exercise.  She says having the help has made a big difference for her.  She is also working on quitting smoking, which has always been a source of stress relief. She is meeitng with her doctor next week for meds/resources.  We commended her for taking this next step in her health.  She is still sleeping well.    Expected Outcomes Short: Continue to work on quitting smoking Long: Continue to cope with caretaking and making time for self.    Interventions Encouraged to attend Pulmonary Rehabilitation for the exercise    Continue Psychosocial Services  Follow up required by staff             Vocational Rehabilitation: Provide vocational rehab assistance to qualifying candidates.   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            Education: Education Goals: Education classes will be provided on a weekly basis, covering required topics. Participant will  state understanding/return demonstration of topics presented.  Learning Barriers/Preferences:  Learning Barriers/Preferences - 11/17/22 0802       Learning Barriers/Preferences   Learning Barriers Sight   glasses   Learning Preferences Written Material             Education Topics: Hypertension, Hypertension Reduction -Define heart disease and high blood pressure. Discus how high blood pressure affects the body and ways to reduce high blood pressure.   Exercise and Your Heart -Discuss why it is important to exercise, the FITT principles of exercise, normal and abnormal responses to exercise, and how to exercise safely.   Angina -Discuss  definition of angina, causes of angina, treatment of angina, and how to decrease risk of having angina.   Cardiac Medications -Review what the following cardiac medications are used for, how they affect the body, and side effects that may occur when taking the medications.  Medications include Aspirin, Beta blockers, calcium channel blockers, ACE Inhibitors, angiotensin receptor blockers, diuretics, digoxin, and antihyperlipidemics. Flowsheet Row CARDIAC REHAB PHASE II EXERCISE from 12/09/2022 in [place] CARDIAC REHABILITATION  Date 12/02/22  Educator DJ       Congestive Heart Failure -Discuss the definition of CHF, how to live with CHF, the signs and symptoms of CHF, and how keep track of weight and sodium intake.   Heart Disease and Intimacy -Discus the effect sexual activity has on the heart, how changes occur during intimacy as we age, and safety during sexual activity.   Smoking Cessation / COPD -Discuss different methods to quit smoking, the health benefits of quitting smoking, and the definition of COPD. Flowsheet Row CARDIAC REHAB PHASE II EXERCISE from 12/09/2022 in [place] CARDIAC REHABILITATION  Date 12/09/22  Educator [facility]  Instruction Review Code 1- Verbalizes Understanding       Nutrition I: Fats -Discuss the types of cholesterol, what cholesterol does to the heart, and how cholesterol levels can be controlled. Flowsheet Row CARDIAC REHAB PHASE II EXERCISE from 11/18/2022 in [place] CARDIAC REHABILITATION  Date 11/18/22  Educator [facility]  Instruction Review Code 1- Verbalizes Understanding       Nutrition II: Labels -Discuss the different components of food labels and how to read food label Flowsheet Row CARDIAC REHAB PHASE II EXERCISE from 11/18/2022 in [place] CARDIAC REHABILITATION  Date 11/18/22  Educator [facility]  Instruction Review Code 1- Verbalizes Understanding       Heart Parts/Heart Disease and PAD -Discuss the anatomy of the heart, the pathway of  blood circulation through the heart, and these are affected by heart disease.   Stress I: Signs and Symptoms -Discuss the causes of stress, how stress may lead to anxiety and depression, and ways to limit stress. Flowsheet Row CARDIAC REHAB PHASE II EXERCISE from 12/09/2022 in [place] CARDIAC REHABILITATION  Date 11/25/22  Educator [facility]  Instruction Review Code 2- Demonstrated Understanding       Stress II: Relaxation -Discuss different types of relaxation techniques to limit stress. Flowsheet Row CARDIAC REHAB PHASE II EXERCISE from 12/09/2022 in [place] CARDIAC REHABILITATION  Date 11/25/22  Educator [facility]  Instruction Review Code 2- Demonstrated Understanding       Warning Signs of Stroke / TIA -Discuss definition of a stroke, what the signs and symptoms are of a stroke, and how to identify when someone is having stroke.   Knowledge Questionnaire Score:  Knowledge Questionnaire Score - 11/17/22 0942       Knowledge Questionnaire Score   Pre Score 23/28  Core Components/Risk Factors/Patient Goals at Admission:  Personal Goals and Risk Factors at Admission - 11/17/22 0942       Core Components/Risk Factors/Patient Goals on Admission    Weight Management Yes;Weight Loss    Intervention Weight Management: Develop a combined nutrition and exercise program designed to reach desired caloric intake, while maintaining appropriate intake of nutrient and fiber, sodium and fats, and appropriate energy expenditure required for the weight goal.;Weight Management: Provide education and appropriate resources to help participant work on and attain dietary goals.    Admit Weight 159 lb 3.2 oz (72.2 kg)    Goal Weight: Short Term 155 lb (70.3 kg)    Goal Weight: Long Term 155 lb (70.3 kg)    Expected Outcomes Short Term: Continue to assess and modify interventions until short term weight is achieved;Long Term: Adherence to nutrition and physical activity/exercise program  aimed toward attainment of established weight goal;Weight Loss: Understanding of general recommendations for a balanced deficit meal plan, which promotes 1-2 lb weight loss per week and includes a negative energy balance of [phone] kcal/d;Understanding recommendations for meals to include 15-35% energy as protein, 25-35% energy from fat, 35-60% energy from carbohydrates, less than 200mg  of dietary cholesterol, 20-35 gm of total fiber daily;Understanding of distribution of calorie intake throughout the day with the consumption of 4-5 meals/snacks;Weight Maintenance: Understanding of the daily nutrition guidelines, which includes 25-35% calories from fat, 7% or less cal from saturated fats, less than 200mg  cholesterol, less than 1.5gm of sodium, & 5 or more servings of fruits and vegetables daily    Tobacco Cessation Yes    Number of packs per day 1/2 pack per day    Intervention Assist the participant in steps to quit. Provide individualized education and counseling about committing to Tobacco Cessation, relapse prevention, and pharmacological support that can be provided by physician.;Education officer, environmental, assist with locating and accessing local/national Quit Smoking programs, and support quit date choice.    Expected Outcomes Short Term: Will demonstrate readiness to quit, by selecting a quit date.;Short Term: Will quit all tobacco product use, adhering to prevention of relapse plan.;Long Term: Complete abstinence from all tobacco products for at least 12 months from quit date.    Diabetes Yes    Intervention Provide education about signs/symptoms and action to take for hypo/hyperglycemia.;Provide education about proper nutrition, including hydration, and aerobic/resistive exercise prescription along with prescribed medications to achieve blood glucose in normal ranges: Fasting glucose 65-99 mg/dL    Expected Outcomes Short Term: Participant verbalizes understanding of the signs/symptoms and  immediate care of hyper/hypoglycemia, proper foot care and importance of medication, aerobic/resistive exercise and nutrition plan for blood glucose control.;Long Term: Attainment of HbA1C < 7%.    Hypertension Yes    Intervention Provide education on lifestyle modifcations including regular physical activity/exercise, weight management, moderate sodium restriction and increased consumption of fresh fruit, vegetables, and low fat dairy, alcohol moderation, and smoking cessation.;Monitor prescription use compliance.    Expected Outcomes Short Term: Continued assessment and intervention until BP is < 140/61mm HG in hypertensive participants. < 130/49mm HG in hypertensive participants with diabetes, heart failure or chronic kidney disease.;Long Term: Maintenance of blood pressure at goal levels.    Lipids Yes    Intervention Provide education and support for participant on nutrition & aerobic/resistive exercise along with prescribed medications to achieve LDL 70mg , HDL >40mg .    Expected Outcomes Short Term: Participant states understanding of desired cholesterol values and is compliant with medications prescribed. Participant is  following exercise prescription and nutrition guidelines.;Long Term: Cholesterol controlled with medications as prescribed, with individualized exercise RX and with personalized nutrition plan. Value goals: LDL < 70mg , HDL > 40 mg.             Core Components/Risk Factors/Patient Goals Review:   Goals and Risk Factor Review     Row Name 11/17/22 0943 11/27/22 1115           Core Components/Risk Factors/Patient Goals Review   Personal Goals Review Tobacco Cessation Weight Management/Obesity;Tobacco Cessation;Hypertension      Review Lasharn is a current tobacco user. Intervention for tobacco cessation was provided at the initial medical review. She was asked about readiness to quit and reported she is trying to reduce her smoking. Patient was advised and educated about  tobacco cessation using combination therapy, tobacco cessation classes, quit line, and quit smoking apps. Patient demonstrated understanding of this material. Staff will continue to provide encouragement and follow up with the patient throughout the program. Pascale is doing well in rehab. Her weight is steady and her pressures have been good.  Her biggest goal is to work on quitting smoking.  She has made up her mind to quit.  She is meeting with her PCP next week to talk about adding in Wellbutrin or Chantix to help.  She has tried patches before but was not successful. We talked about using all her options in unison to get an improved benefit.  She has not tried lozenges or gum before so we talked through those options as well.      Expected Outcomes Short: Try patches with gum/lonzenge Long: Set potential quit date Shrot: Meet with MD about quitting smoking Long: Set quit date and keep monitoring risk factors.               Core Components/Risk Factors/Patient Goals at Discharge (Final Review):   Goals and Risk Factor Review - 11/27/22 1115       Core Components/Risk Factors/Patient Goals Review   Personal Goals Review Weight Management/Obesity;Tobacco Cessation;Hypertension    Review Kaiesha is doing well in rehab. Her weight is steady and her pressures have been good.  Her biggest goal is to work on quitting smoking.  She has made up her mind to quit.  She is meeting with her PCP next week to talk about adding in Wellbutrin or Chantix to help.  She has tried patches before but was not successful. We talked about using all her options in unison to get an improved benefit.  She has not tried lozenges or gum before so we talked through those options as well.    Expected Outcomes Shrot: Meet with MD about quitting smoking Long: Set quit date and keep monitoring risk factors.             ITP Comments:  ITP Comments     Row Name 11/16/22 1112 11/17/22 0927 11/18/22 0831 12/16/22 0824     ITP  Comments Virtual orientation completed.  Diagnosis documentation found in [facility] 11-05-22 .  Orientation schedule 11-17-22 8am. Patient attend orientation today.  Patient is attendingCardiac Rehabilitation Program.  Documentation for diagnosis can be found in [facility] 11/03/22.  Reviewed medical chart, RPE/RPD, gym safety, and program guidelines.  Patient was fitted to equipment they will be using during rehab.  Patient is scheduled to start exercise on 11/18/22 at 1100.   Initial ITP created and sent for review and signature by Dr. Dina Rich, Medical Director for Cardiac Rehabilitation Program. 30 day  review completed. ITP sent to Dr. Dina Rich, Medical Director of Cardiac Rehab. Continue with ITP unless changes are made by physician.  Only attended orinetaion thus far 30 day review completed. ITP sent to Dr. Dina Rich, Medical Director of Cardiac Rehab. Continue with ITP unless changes are made by physician.             Comments: 30 day review

## 2022-12-18 ENCOUNTER — Encounter (HOSPITAL_COMMUNITY)
Admission: RE | Admit: 2022-12-18 | Discharge: 2022-12-18 | Disposition: A | Discharge status: Home / Self Care | Payer: Medicaid Other | Source: Ambulatory Visit | Attending: Cardiology | Admitting: Cardiology

## 2022-12-18 DIAGNOSIS — I214 Non-ST elevation (NSTEMI) myocardial infarction: Secondary | ICD-10-CM

## 2022-12-18 DIAGNOSIS — Z955 Presence of coronary angioplasty implant and graft: Secondary | ICD-10-CM

## 2022-12-18 NOTE — Progress Notes (Signed)
Daily Session Note  Patient Details  Name: [name] MRN: [id number] Date of Birth: 1960/09/27 Referring Provider:   Flowsheet Row CARDIAC REHAB PHASE II ORIENTATION from 11/17/2022 in [facility] CARDIAC REHABILITATION  Referring Provider Ramiro Harvest MD  [Attending Cardiologist: Dr. Dina Rich       Encounter Date: 12/18/2022  Check In:  Session Check In - 12/18/22 1100       Check-In   Supervising physician immediately available to respond to emergencies See telemetry face sheet for immediately available MD    Location [facility]    Staff Present Ross Ludwig, BS, Exercise Physiologist;Debra Laural Benes, RN, Pleas Koch, RN, BSN;Jessica Hawkins, MA, RCEP, CCRP, CCET    Virtual Visit No    Medication changes reported     No    Fall or balance concerns reported    No    Tobacco Cessation No Change    Current number of cigarettes/nicotine per day     5    Warm-up and Cool-down Performed on first and last piece of equipment    Resistance Training Performed Yes    VAD Patient? No    PAD/SET Patient? No      Pain Assessment   Currently in Pain? No/denies    Pain Score 0-No pain    Multiple Pain Sites No             Capillary Blood Glucose: No results found for this or any previous visit (from the past 24 hour(s)).    Social History   Tobacco Use  Smoking Status Every Day   Current packs/day: 0.50   Types: Cigarettes   Passive exposure: Never  Smokeless Tobacco Never    Goals Met:  Independence with exercise equipment Exercise tolerated well No report of concerns or symptoms today Strength training completed today  Goals Unmet:  Not Applicable  Comments: Pt able to follow exercise prescription today without complaint.  Will continue to monitor for progression.    Dr. Dina Rich is Medical Director for [facility] Cardiac Rehab

## 2022-12-21 ENCOUNTER — Encounter (HOSPITAL_COMMUNITY)
Admission: RE | Admit: 2022-12-21 | Discharge: 2022-12-21 | Disposition: A | Discharge status: Home / Self Care | Payer: Medicaid Other | Source: Ambulatory Visit | Attending: Cardiology | Admitting: Cardiology

## 2022-12-21 DIAGNOSIS — I214 Non-ST elevation (NSTEMI) myocardial infarction: Secondary | ICD-10-CM

## 2022-12-21 DIAGNOSIS — Z955 Presence of coronary angioplasty implant and graft: Secondary | ICD-10-CM

## 2022-12-21 NOTE — Progress Notes (Signed)
Daily Session Note  Patient Details  Name: [name] MRN: [id number] Date of Birth: 1960/08/08 Referring Provider:   Flowsheet Row CARDIAC REHAB PHASE II ORIENTATION from 11/17/2022 in [facility] CARDIAC REHABILITATION  Referring Provider Ramiro Harvest MD  [Attending Cardiologist: Dr. Dina Rich       Encounter Date: 12/21/2022  Check In:  Session Check In - 12/21/22 1100       Check-In   Supervising physician immediately available to respond to emergencies See telemetry face sheet for immediately available MD    Location [facility]    Staff Present Ross Ludwig, BS, Exercise Physiologist;Debra Laural Benes, RN, BSN;Marisa Hufstetler, RN;Jessica Marty, MA, RCEP, CCRP, CCET    Virtual Visit No    Medication changes reported     No    Fall or balance concerns reported    No    Tobacco Cessation No Change    Current number of cigarettes/nicotine per day     5    Warm-up and Cool-down Performed on first and last piece of equipment    Resistance Training Performed Yes    VAD Patient? No    PAD/SET Patient? No      Pain Assessment   Currently in Pain? No/denies    Pain Score 0-No pain    Multiple Pain Sites No             Capillary Blood Glucose: No results found for this or any previous visit (from the past 24 hour(s)).    Social History   Tobacco Use  Smoking Status Every Day   Current packs/day: 0.50   Types: Cigarettes   Passive exposure: Never  Smokeless Tobacco Never    Goals Met:  Independence with exercise equipment Exercise tolerated well No report of concerns or symptoms today  Goals Unmet:  Not Applicable  Comments: Pt able to follow exercise prescription today without complaint.  Will continue to monitor for progression.    Dr. Dina Rich is Medical Director for [facility] Cardiac Rehab

## 2022-12-23 ENCOUNTER — Encounter (HOSPITAL_COMMUNITY)
Admission: RE | Admit: 2022-12-23 | Discharge: 2022-12-23 | Disposition: A | Discharge status: Home / Self Care | Payer: Medicaid Other | Source: Ambulatory Visit | Attending: Cardiology | Admitting: Cardiology

## 2022-12-23 DIAGNOSIS — Z955 Presence of coronary angioplasty implant and graft: Secondary | ICD-10-CM

## 2022-12-23 DIAGNOSIS — I214 Non-ST elevation (NSTEMI) myocardial infarction: Secondary | ICD-10-CM

## 2022-12-23 NOTE — Progress Notes (Signed)
Daily Session Note  Patient Details  Name: FELICHIA BRIDENSTINE MRN: [id number] Date of Birth: 1960/04/20 Referring Provider:   Flowsheet Row CARDIAC REHAB PHASE II ORIENTATION from 11/17/2022 in [facility] CARDIAC REHABILITATION  Referring Provider Ramiro Harvest MD  [Attending Cardiologist: Dr. Dina Rich       Encounter Date: 12/23/2022  Check In:  Session Check In - 12/23/22 1020       Check-In   Supervising physician immediately available to respond to emergencies [facility] MD immediately available    Physician(s) Dr. Jenene Slicker    Location [facility]    Staff Present Ross Ludwig, BS, Exercise Physiologist;Alexcis Bicking BSN, RN;Jessica Chester, MA, RCEP, CCRP, CCET    Virtual Visit No    Medication changes reported     No    Fall or balance concerns reported    No    Tobacco Cessation No Change    Warm-up and Cool-down Performed on first and last piece of equipment    Resistance Training Performed Yes    VAD Patient? No    PAD/SET Patient? No      Pain Assessment   Currently in Pain? No/denies    Pain Score 0-No pain    Multiple Pain Sites No             Capillary Blood Glucose: No results found for this or any previous visit (from the past 24 hour(s)).    Social History   Tobacco Use  Smoking Status Every Day   Current packs/day: 0.50   Types: Cigarettes   Passive exposure: Never  Smokeless Tobacco Never    Goals Met:  Independence with exercise equipment Exercise tolerated well No report of concerns or symptoms today Strength training completed today  Goals Unmet:  Not Applicable  Comments: Marland KitchenMarland KitchenPt able to follow exercise prescription today without complaint.  Will continue to monitor for progression.    Dr. Dina Rich is Medical Director for [facility] Cardiac Rehab

## 2022-12-25 ENCOUNTER — Encounter (HOSPITAL_COMMUNITY)
Admission: RE | Admit: 2022-12-25 | Discharge: 2022-12-25 | Disposition: A | Discharge status: Home / Self Care | Payer: Medicaid Other | Source: Ambulatory Visit | Attending: Cardiology | Admitting: Cardiology

## 2022-12-25 DIAGNOSIS — Z955 Presence of coronary angioplasty implant and graft: Secondary | ICD-10-CM

## 2022-12-25 DIAGNOSIS — I214 Non-ST elevation (NSTEMI) myocardial infarction: Secondary | ICD-10-CM | POA: Diagnosis not present

## 2022-12-25 NOTE — Progress Notes (Signed)
Daily Session Note  Patient Details  Name: [name] MRN: [id number] Date of Birth: 1960/07/12 Referring Provider:   Flowsheet Row CARDIAC REHAB PHASE II ORIENTATION from 11/17/2022 in [facility] CARDIAC REHABILITATION  Referring Provider Ramiro Harvest MD  [Attending Cardiologist: Dr. Dina Rich       Encounter Date: 12/25/2022  Check In:  Session Check In - 12/25/22 1045       Check-In   Supervising physician immediately available to respond to emergencies See telemetry face sheet for immediately available ER MD    Location [facility]    Staff Present Staci Righter, RN, Neal Dy, RN, BSN;Jessica Juanetta Gosling, MA, RCEP, CCRP, CCET;Heather Fredric Mare, [place], Exercise Physiologist    Virtual Visit No    Medication changes reported     No    Fall or balance concerns reported    No    Warm-up and Cool-down Performed on first and last piece of equipment    Resistance Training Performed Yes    VAD Patient? No    PAD/SET Patient? No      Pain Assessment   Currently in Pain? No/denies    Pain Score 0-No pain    Multiple Pain Sites No             Capillary Blood Glucose: No results found for this or any previous visit (from the past 24 hour(s)).    Social History   Tobacco Use  Smoking Status Every Day   Current packs/day: 0.50   Types: Cigarettes   Passive exposure: Never  Smokeless Tobacco Never    Goals Met:  Independence with exercise equipment Exercise tolerated well No report of concerns or symptoms today Strength training completed today  Goals Unmet:  Not Applicable  Comments: Pt able to follow exercise prescription today without complaint.  Will continue to monitor for progression.    Dr. Dina Rich is Medical Director for [facility] Cardiac Rehab

## 2022-12-28 ENCOUNTER — Encounter (HOSPITAL_COMMUNITY)
Admission: RE | Admit: 2022-12-28 | Discharge: 2022-12-28 | Disposition: A | Discharge status: Home / Self Care | Payer: Medicaid Other | Source: Ambulatory Visit | Attending: Cardiology | Admitting: Cardiology

## 2022-12-28 DIAGNOSIS — I214 Non-ST elevation (NSTEMI) myocardial infarction: Secondary | ICD-10-CM

## 2022-12-28 DIAGNOSIS — Z955 Presence of coronary angioplasty implant and graft: Secondary | ICD-10-CM

## 2022-12-28 NOTE — Progress Notes (Signed)
Daily Session Note  Patient Details  Name: [name] MRN: [id number] Date of Birth: [birth year] Referring Provider:   Flowsheet Row CARDIAC REHAB PHASE II ORIENTATION from 11/17/2022 in [facility] CARDIAC REHABILITATION  Referring Provider Ramiro Harvest MD  [Attending Cardiologist: Dr. Dina Rich       Encounter Date: 12/28/2022  Check In:  Session Check In - 12/28/22 1115       Check-In   Supervising physician immediately available to respond to emergencies See telemetry face sheet for immediately available MD    Location [facility]    Staff Present Ross Ludwig, BS, Exercise Physiologist;Debra Laural Benes, RN, BSN;Fraida Veldman, RN;Daphyne Daphine Deutscher, RN, BSN;Jessica Hawkins, MA, RCEP, CCRP, CCET    Virtual Visit No    Medication changes reported     No    Fall or balance concerns reported    No    Tobacco Cessation No Change    Warm-up and Cool-down Performed on first and last piece of equipment    Resistance Training Performed Yes    VAD Patient? No    PAD/SET Patient? No      Pain Assessment   Currently in Pain? No/denies    Pain Score 0-No pain    Multiple Pain Sites No             Capillary Blood Glucose: No results found for this or any previous visit (from the past 24 hour(s)).    Social History   Tobacco Use  Smoking Status Every Day   Current packs/day: 0.50   Types: Cigarettes   Passive exposure: Never  Smokeless Tobacco Never    Goals Met:  Independence with exercise equipment Exercise tolerated well No report of concerns or symptoms today Strength training completed today  Goals Unmet:  Not Applicable  Comments: Pt able to follow exercise prescription today without complaint.  Will continue to monitor for progression.    Dr. Dina Rich is Medical Director for [facility] Cardiac Rehab

## 2022-12-30 ENCOUNTER — Encounter (HOSPITAL_COMMUNITY)
Admission: RE | Admit: 2022-12-30 | Discharge: 2022-12-30 | Disposition: A | Discharge status: Home / Self Care | Payer: Medicaid Other | Source: Ambulatory Visit | Attending: Cardiology | Admitting: Cardiology

## 2022-12-30 DIAGNOSIS — I214 Non-ST elevation (NSTEMI) myocardial infarction: Secondary | ICD-10-CM

## 2022-12-30 DIAGNOSIS — Z955 Presence of coronary angioplasty implant and graft: Secondary | ICD-10-CM

## 2022-12-30 NOTE — Progress Notes (Signed)
Daily Session Note  Patient Details  Name: [name] MRN: [id number] Date of Birth: 19-Dec-1960 Referring Provider:   Flowsheet Row CARDIAC REHAB PHASE II ORIENTATION from 11/17/2022 in [facility] CARDIAC REHABILITATION  Referring Provider Ramiro Harvest MD  [Attending Cardiologist: Dr. Dina Rich       Encounter Date: 12/30/2022  Check In:  Session Check In - 12/30/22 1039       Check-In   Supervising physician immediately available to respond to emergencies See telemetry face sheet for immediately available MD    Location [facility]    Staff Present Ross Ludwig, BS, Exercise Physiologist;Jessica Juanetta Gosling, MA, RCEP, CCRP, CCET;Hillary Troutman BSN, RN    Virtual Visit No    Medication changes reported     No    Fall or balance concerns reported    No    Tobacco Cessation No Change    Warm-up and Cool-down Performed on first and last piece of equipment    Resistance Training Performed Yes    VAD Patient? No    PAD/SET Patient? No      Pain Assessment   Currently in Pain? No/denies    Pain Score 0-No pain             Capillary Blood Glucose: No results found for this or any previous visit (from the past 24 hour(s)).    Social History   Tobacco Use  Smoking Status Every Day   Current packs/day: 0.50   Types: Cigarettes   Passive exposure: Never  Smokeless Tobacco Never    Goals Met:  Independence with exercise equipment Exercise tolerated well No report of concerns or symptoms today Strength training completed today  Goals Unmet:  Not Applicable  Comments: Pt able to follow exercise prescription today without complaint.  Will continue to monitor for progression.    Dr. Dina Rich is Medical Director for [facility] Cardiac Rehab

## 2023-01-01 ENCOUNTER — Encounter (HOSPITAL_COMMUNITY)
Admission: RE | Admit: 2023-01-01 | Discharge: 2023-01-01 | Disposition: A | Discharge status: Home / Self Care | Payer: Medicaid Other | Source: Ambulatory Visit | Attending: Cardiology | Admitting: Cardiology

## 2023-01-01 DIAGNOSIS — I214 Non-ST elevation (NSTEMI) myocardial infarction: Secondary | ICD-10-CM

## 2023-01-01 DIAGNOSIS — Z955 Presence of coronary angioplasty implant and graft: Secondary | ICD-10-CM

## 2023-01-01 NOTE — Progress Notes (Signed)
Daily Session Note  Patient Details  Name: [name] MRN: [id number] Date of Birth: [birth year] Referring Provider:   Flowsheet Row CARDIAC REHAB PHASE II ORIENTATION from 11/17/2022 in [facility] CARDIAC REHABILITATION  Referring Provider Ramiro Harvest MD  [Attending Cardiologist: Dr. Dina Rich       Encounter Date: 01/01/2023  Check In:  Session Check In - 01/01/23 1100       Check-In   Supervising physician immediately available to respond to emergencies See telemetry face sheet for immediately available MD    Location [facility]    Staff Present Ross Ludwig, BS, Exercise Physiologist;Debra Laural Benes, RN, BSN;Jessica Hawkins, MA, RCEP, CCRP, CCET    Virtual Visit No    Medication changes reported     No    Fall or balance concerns reported    No    Tobacco Cessation No Change    Warm-up and Cool-down Performed on first and last piece of equipment    Resistance Training Performed Yes    VAD Patient? No    PAD/SET Patient? No      Pain Assessment   Currently in Pain? No/denies    Pain Score 0-No pain    Multiple Pain Sites No             Capillary Blood Glucose: No results found for this or any previous visit (from the past 24 hour(s)).    Social History   Tobacco Use  Smoking Status Every Day   Current packs/day: 0.50   Types: Cigarettes   Passive exposure: Never  Smokeless Tobacco Never    Goals Met:  Independence with exercise equipment Exercise tolerated well No report of concerns or symptoms today Strength training completed today  Goals Unmet:  Not Applicable  Comments: Pt able to follow exercise prescription today without complaint.  Will continue to monitor for progression.    Dr. Dina Rich is Medical Director for [facility] Cardiac Rehab

## 2023-01-04 ENCOUNTER — Encounter (HOSPITAL_COMMUNITY)
Admission: RE | Admit: 2023-01-04 | Discharge: 2023-01-04 | Disposition: A | Discharge status: Home / Self Care | Payer: Medicaid Other | Source: Ambulatory Visit | Attending: Cardiology | Admitting: Cardiology

## 2023-01-04 DIAGNOSIS — Z955 Presence of coronary angioplasty implant and graft: Secondary | ICD-10-CM

## 2023-01-04 DIAGNOSIS — I214 Non-ST elevation (NSTEMI) myocardial infarction: Secondary | ICD-10-CM | POA: Diagnosis not present

## 2023-01-04 NOTE — Progress Notes (Signed)
Daily Session Note  Patient Details  Name: [name] MRN: [id number] Date of Birth: [birth year] Referring Provider:   Flowsheet Row CARDIAC REHAB PHASE II ORIENTATION from 11/17/2022 in [facility] CARDIAC REHABILITATION  Referring Provider Ramiro Harvest MD  [Attending Cardiologist: Dr. Dina Rich       Encounter Date: 01/04/2023  Check In:  Session Check In - 01/04/23 1045       Check-In   Supervising physician immediately available to respond to emergencies See telemetry face sheet for immediately available ER MD    Location [facility]    Staff Present Fabio Pierce, MA, RCEP, CCRP, Dow Adolph, RN, BSN;[employer], RN;Heather Fredric Mare, BS, Exercise Physiologist    Virtual Visit No    Medication changes reported     No    Fall or balance concerns reported    No    Warm-up and Cool-down Performed on first and last piece of equipment    Resistance Training Performed Yes    VAD Patient? No    PAD/SET Patient? No      Pain Assessment   Currently in Pain? No/denies    Pain Score 0-No pain    Multiple Pain Sites No             Capillary Blood Glucose: No results found for this or any previous visit (from the past 24 hour(s)).    Social History   Tobacco Use  Smoking Status Every Day   Current packs/day: 0.50   Types: Cigarettes   Passive exposure: Never  Smokeless Tobacco Never    Goals Met:  Independence with exercise equipment Exercise tolerated well No report of concerns or symptoms today Strength training completed today  Goals Unmet:  Not Applicable  Comments: Pt able to follow exercise prescription today without complaint.  Will continue to monitor for progression.    Dr. Dina Rich is Medical Director for [facility] Cardiac Rehab

## 2023-01-06 ENCOUNTER — Encounter (HOSPITAL_COMMUNITY)
Admission: RE | Admit: 2023-01-06 | Discharge: 2023-01-06 | Disposition: A | Discharge status: Home / Self Care | Payer: Medicaid Other | Source: Ambulatory Visit | Attending: Cardiology | Admitting: Cardiology

## 2023-01-06 DIAGNOSIS — Z955 Presence of coronary angioplasty implant and graft: Secondary | ICD-10-CM | POA: Diagnosis present

## 2023-01-06 DIAGNOSIS — I214 Non-ST elevation (NSTEMI) myocardial infarction: Secondary | ICD-10-CM | POA: Insufficient documentation

## 2023-01-06 NOTE — Progress Notes (Signed)
Daily Session Note  Patient Details  Name: [name] MRN: [id number] Date of Birth: 1960-12-07 Referring Provider:   Flowsheet Row CARDIAC REHAB PHASE II ORIENTATION from 11/17/2022 in [facility] CARDIAC REHABILITATION  Referring Provider Ramiro Harvest MD  [Attending Cardiologist: Dr. Dina Rich       Encounter Date: 01/06/2023  Check In:  Session Check In - 01/06/23 1020       Check-In   Supervising physician immediately available to respond to emergencies See telemetry face sheet for immediately available MD    Location [facility]    Staff Present Ross Ludwig, BS, Exercise Physiologist;Quantavia Frith Felt BSN, RN;Jessica [place], MA, RCEP, CCRP, Dow Adolph, RN, BSN    Virtual Visit No    Medication changes reported     No    Fall or balance concerns reported    No    Tobacco Cessation No Change    Warm-up and Cool-down Performed on first and last piece of equipment    Resistance Training Performed Yes    VAD Patient? No    PAD/SET Patient? No      Pain Assessment   Currently in Pain? No/denies    Pain Score 0-No pain    Multiple Pain Sites No             Capillary Blood Glucose: No results found for this or any previous visit (from the past 24 hour(s)).    Social History   Tobacco Use  Smoking Status Every Day   Current packs/day: 0.50   Types: Cigarettes   Passive exposure: Never  Smokeless Tobacco Never    Goals Met:  Independence with exercise equipment Exercise tolerated well No report of concerns or symptoms today Strength training completed today  Goals Unmet:  Not Applicable  Comments: Marland KitchenMarland KitchenPt able to follow exercise prescription today without complaint.  Will continue to monitor for progression.    Dr. Dina Rich is Medical Director for [facility] Cardiac Rehab

## 2023-01-08 ENCOUNTER — Encounter (HOSPITAL_COMMUNITY)
Admission: RE | Admit: 2023-01-08 | Discharge: 2023-01-08 | Disposition: A | Discharge status: Home / Self Care | Payer: Medicaid Other | Source: Ambulatory Visit | Attending: Cardiology

## 2023-01-08 VITALS — Ht 66.5 in | Wt 158.7 lb

## 2023-01-08 DIAGNOSIS — I214 Non-ST elevation (NSTEMI) myocardial infarction: Secondary | ICD-10-CM

## 2023-01-08 DIAGNOSIS — Z955 Presence of coronary angioplasty implant and graft: Secondary | ICD-10-CM

## 2023-01-08 NOTE — Progress Notes (Signed)
Daily Session Note  Patient Details  Name: [name] MRN: [id number] Date of Birth: May 18, 1960 Referring Provider:   Flowsheet Row CARDIAC REHAB PHASE II ORIENTATION from 11/17/2022 in [facility] CARDIAC REHABILITATION  Referring Provider Ramiro Harvest MD  [Attending Cardiologist: Dr. Dina Rich       Encounter Date: 01/08/2023  Check In:  Session Check In - 01/08/23 1045       Check-In   Supervising physician immediately available to respond to emergencies See telemetry face sheet for immediately available ER MD    Location [facility]    Staff Present Rodena Medin, RN, BSN;Jessica Juanetta Gosling, MA, RCEP, CCRP, CCET;[employer], RN    Medication changes reported     No    Fall or balance concerns reported    No    Warm-up and Cool-down Performed on first and last piece of equipment    Resistance Training Performed Yes    VAD Patient? No    PAD/SET Patient? No      Pain Assessment   Currently in Pain? No/denies    Pain Score 0-No pain    Multiple Pain Sites No             Capillary Blood Glucose: No results found for this or any previous visit (from the past 24 hour(s)).    Social History   Tobacco Use  Smoking Status Every Day   Current packs/day: 0.50   Types: Cigarettes   Passive exposure: Never  Smokeless Tobacco Never    Goals Met:  Independence with exercise equipment Exercise tolerated well No report of concerns or symptoms today Strength training completed today  Goals Unmet:  Not Applicable  Comments: Pt able to follow exercise prescription today without complaint.  Will continue to monitor for progression.    Dr. Dina Rich is Medical Director for [facility] Cardiac Rehab

## 2023-01-08 NOTE — Patient Instructions (Signed)
Discharge Patient Instructions  Patient Details  Name: [name] MRN: [id number] Date of Birth: 1960-12-11 Referring Provider:  Tylene Fantasia., PA-C   Number of Visits: 45  Reason for Discharge:  Patient reached a stable level of exercise. Patient independent in their exercise. Patient has met program and personal goals.  Smoking History:  Social History   Tobacco Use  Smoking Status Every Day   Current packs/day: 0.50   Types: Cigarettes   Passive exposure: Never  Smokeless Tobacco Never    Diagnosis:  NSTEMI (non-ST elevated myocardial infarction) (HCC)  Status post coronary artery stent placement  Initial Exercise Prescription:  Initial Exercise Prescription - 11/17/22 0900       Date of Initial Exercise RX and Referring Provider   Date 11/17/22    Referring Provider Ramiro Harvest MD   Attending Cardiologist: Dr. Dina Rich   Expected Discharge Date 02/12/23   Medicaid     Oxygen   Maintain Oxygen Saturation 88% or higher      Treadmill   MPH 2.5    Grade 1    Minutes 15    METs 3.26      REL-XR   Level 4    Speed 50    Minutes 15    METs 3.2      Prescription Details   Frequency (times per week) 3    Duration Progress to 30 minutes of continuous aerobic without signs/symptoms of physical distress      Intensity   THRR 40-80% of Max Heartrate 105-142    Ratings of Perceived Exertion 11-13    Perceived Dyspnea 0-4      Progression   Progression Continue to progress workloads to maintain intensity without signs/symptoms of physical distress.      Resistance Training   Training Prescription Yes    Weight 4 lb    Reps 10-15             Discharge Exercise Prescription (Final Exercise Prescription Changes):  Exercise Prescription Changes - 12/28/22 1200       Response to Exercise   Blood Pressure (Admit) 106/60    Blood Pressure (Exit) 106/60    Heart Rate (Admit) 58 bpm    Heart Rate (Exercise) 107 bpm    Heart Rate  (Exit) 66 bpm    Rating of Perceived Exertion (Exercise) 13    Duration Continue with 30 min of aerobic exercise without signs/symptoms of physical distress.    Intensity THRR unchanged      Progression   Progression Continue to progress workloads to maintain intensity without signs/symptoms of physical distress.      Resistance Training   Training Prescription Yes    Weight 4 lbs    Reps 10-15      Treadmill   MPH 2.9    Grade 2    Minutes 15    METs 4.02      REL-XR   Level 3    Speed 70    Minutes 15    METs 6.3      Home Exercise Plan   Plans to continue exercise at [facility] (comment)    Frequency Add 2 additional days to program exercise sessions.      Oxygen   Maintain Oxygen Saturation 88% or higher             Functional Capacity:  6 Minute Walk     Row Name 11/17/22 0929 01/08/23 1415       6  Minute Walk   Phase Initial Discharge    Distance 1300 feet 200 feet    Distance % Change -- 53.8 %    Distance Feet Change -- 700 ft    Walk Time 6 minutes 6 minutes    # of Rest Breaks 0 0    MPH 2.46 3.79    METS 3.59 4.59    RPE 11 9    VO2 Peak 12.57 16.06    Symptoms No No    Resting HR 77 bpm 55 bpm    Resting BP 126/64 106/60    Resting Oxygen Saturation  96 % --    Exercise Oxygen Saturation  during 6 min walk 97 % --    Max Ex. HR 101 bpm 84 bpm    Max Ex. BP 134/72 124/70    2 Minute Post BP 124/62 --           Nutrition & Weight - Outcomes:  Pre Biometrics - 11/17/22 0933       Pre Biometrics   Height 5' 6.5" (1.689 m)    Weight 72.2 kg    Waist Circumference 34.25 inches    Hip Circumference 38 inches    Waist to Hip Ratio 0.9 %    BMI (Calculated) 25.31    Grip Strength 30 kg    Single Leg Stand 30 seconds             Post Biometrics - 01/08/23 1417        Post  Biometrics   Height 5' 6.5" (1.689 m)    Weight 72 kg    Waist Circumference 34 inches    Hip Circumference 38 inches    Waist to Hip Ratio  0.89 %    BMI (Calculated) 25.23    Grip Strength 27.9 kg    Single Leg Stand 30 seconds             Nutrition:  Nutrition Therapy & Goals - 11/17/22 0941       Intervention Plan   Intervention Prescribe, educate and counsel regarding individualized specific dietary modifications aiming towards targeted core components such as weight, hypertension, lipid management, diabetes, heart failure and other comorbidities.;Nutrition handout(s) given to patient.    Expected Outcomes Short Term Goal: Understand basic principles of dietary content, such as calories, fat, sodium, cholesterol and nutrients.;Long Term Goal: Adherence to prescribed nutrition plan.             Nutrition Discharge:   Education Questionnaire Score:  Knowledge Questionnaire Score - 11/17/22 0942       Knowledge Questionnaire Score   Pre Score 23/28             Goals reviewed with patient; copy given to patient.

## 2023-01-11 ENCOUNTER — Encounter (HOSPITAL_COMMUNITY)
Admission: RE | Admit: 2023-01-11 | Discharge: 2023-01-11 | Disposition: A | Discharge status: Home / Self Care | Payer: Medicaid Other | Source: Ambulatory Visit | Attending: Cardiology | Admitting: Cardiology

## 2023-01-11 DIAGNOSIS — I214 Non-ST elevation (NSTEMI) myocardial infarction: Secondary | ICD-10-CM | POA: Diagnosis not present

## 2023-01-11 DIAGNOSIS — Z955 Presence of coronary angioplasty implant and graft: Secondary | ICD-10-CM

## 2023-01-11 NOTE — Progress Notes (Signed)
Daily Session Note  Patient Details  Name: [name] MRN: [id number] Date of Birth: Dec 17, 1960 Referring Provider:   Flowsheet Row CARDIAC REHAB PHASE II ORIENTATION from 11/17/2022 in [facility] CARDIAC REHABILITATION  Referring Provider Ramiro Harvest MD  [Attending Cardiologist: Dr. Dina Rich       Encounter Date: 01/11/2023  Check In:  Session Check In - 01/11/23 1045       Check-In   Supervising physician immediately available to respond to emergencies See telemetry face sheet for immediately available ER MD    Location [facility]    Staff Present Rodena Medin, RN, Pleas Koch, RN, BSN    Virtual Visit No    Medication changes reported     No    Fall or balance concerns reported    No    Warm-up and Cool-down Performed on first and last piece of equipment    Resistance Training Performed Yes    VAD Patient? No    PAD/SET Patient? No      Pain Assessment   Currently in Pain? No/denies    Pain Score 0-No pain    Multiple Pain Sites No             Capillary Blood Glucose: No results found for this or any previous visit (from the past 24 hour(s)).    Social History   Tobacco Use  Smoking Status Every Day   Current packs/day: 0.50   Types: Cigarettes   Passive exposure: Never  Smokeless Tobacco Never    Goals Met:  Independence with exercise equipment Exercise tolerated well No report of concerns or symptoms today Strength training completed today  Goals Unmet:  Not Applicable  Comments: Pt able to follow exercise prescription today without complaint.  Will continue to monitor for progression.    Dr. Dina Rich is Medical Director for [facility] Cardiac Rehab

## 2023-01-13 ENCOUNTER — Encounter (HOSPITAL_COMMUNITY): Payer: Self-pay | Admitting: *Deleted

## 2023-01-13 ENCOUNTER — Encounter (HOSPITAL_COMMUNITY)
Admission: RE | Admit: 2023-01-13 | Discharge: 2023-01-13 | Disposition: A | Discharge status: Home / Self Care | Payer: Medicaid Other | Source: Ambulatory Visit | Attending: Cardiology | Admitting: Cardiology

## 2023-01-13 DIAGNOSIS — Z955 Presence of coronary angioplasty implant and graft: Secondary | ICD-10-CM

## 2023-01-13 DIAGNOSIS — I214 Non-ST elevation (NSTEMI) myocardial infarction: Secondary | ICD-10-CM

## 2023-01-13 NOTE — Progress Notes (Signed)
Daily Session Note  Patient Details  Name: [name] MRN: [id number] Date of Birth: [birth year] Referring Provider:   Flowsheet Row CARDIAC REHAB PHASE II ORIENTATION from 11/17/2022 in [facility] CARDIAC REHABILITATION  Referring Provider Ramiro Harvest MD  [Attending Cardiologist: Dr. Dina Rich       Encounter Date: 01/13/2023  Check In:  Session Check In - 01/13/23 1034       Check-In   Supervising physician immediately available to respond to emergencies See telemetry face sheet for immediately available MD    Location [facility]    Staff Present Ross Ludwig, BS, Exercise Physiologist;Jessica Juanetta Gosling, MA, RCEP, CCRP, CCET    Virtual Visit No    Medication changes reported     No    Fall or balance concerns reported    No    Tobacco Cessation No Change    Warm-up and Cool-down Performed on first and last piece of equipment    VAD Patient? No    PAD/SET Patient? No      Pain Assessment   Currently in Pain? No/denies    Pain Score 0-No pain    Multiple Pain Sites No             Capillary Blood Glucose: No results found for this or any previous visit (from the past 24 hour(s)).    Social History   Tobacco Use  Smoking Status Every Day   Current packs/day: 0.50   Types: Cigarettes   Passive exposure: Never  Smokeless Tobacco Never    Goals Met:  Independence with exercise equipment Exercise tolerated well No report of concerns or symptoms today Strength training completed today  Goals Unmet:  Not Applicable  Comments: Pt able to follow exercise prescription today without complaint.  Will continue to monitor for progression.     Dr. Dina Rich is Medical Director for [facility] Cardiac Rehab

## 2023-01-13 NOTE — Progress Notes (Signed)
Cardiac Individual Treatment Plan  Patient Details  Name: [name] MRN: [id number] Date of Birth: April 16, 1960 Referring Provider:   Flowsheet Row CARDIAC REHAB PHASE II ORIENTATION from 11/17/2022 in [facility] CARDIAC REHABILITATION  Referring Provider Ramiro Harvest MD  [Attending Cardiologist: Dr. Dina Rich       Initial Encounter Date:  Flowsheet Row CARDIAC REHAB PHASE II ORIENTATION from 11/17/2022 in [facility] CARDIAC REHABILITATION  Date 11/17/22       Visit Diagnosis: NSTEMI (non-ST elevated myocardial infarction) [facility])  Status post coronary artery stent placement  Patient's Home Medications on Admission:  Current Outpatient Medications:    aspirin EC 81 MG tablet, Take 1 tablet (81 mg total) by mouth daily. Swallow whole., Disp: 120 tablet, Rfl: 0   atorvastatin (LIPITOR) 40 MG tablet, Take 1 tablet (40 mg total) by mouth every evening., Disp: 90 tablet, Rfl: 0   Calcium Carbonate Antacid (ANTACID PO), Take 1 tablet by mouth daily., Disp: , Rfl:    cetirizine (ZYRTEC) 10 MG tablet, Take 10 mg by mouth daily., Disp: , Rfl:    clopidogrel (PLAVIX) 75 MG tablet, Take 1 tablet (75 mg total) by mouth daily., Disp: 90 tablet, Rfl: 0   desonide (DESOWEN) 0.05 % cream, Use twice daily for flare ups on face, maximum 10 days., Disp: 60 g, Rfl: 5   diphenhydrAMINE (BENADRYL ALLERGY) 25 MG tablet, Take 50 mg by mouth 2 (two) times daily. PRN, Disp: , Rfl:    EPINEPHrine 0.3 mg/0.3 mL IJ SOAJ injection, Inject into the muscle., Disp: , Rfl:    hydrochlorothiazide (HYDRODIURIL) 12.5 MG tablet, Take 12.5 mg by mouth daily., Disp: , Rfl:    hydrOXYzine (ATARAX) 25 MG tablet, Take 25 mg by mouth 4 (four) times daily as needed., Disp: , Rfl:    KRILL OIL PO, Take 2 tablets by mouth 2 (two) times daily., Disp: , Rfl:    metFORMIN (GLUCOPHAGE) 1000 MG tablet, Take 1,000 mg by mouth 2 (two) times daily., Disp: , Rfl:    metoprolol succinate (TOPROL-XL) 25 MG 24 hr tablet, Take  0.5 tablets (12.5 mg total) by mouth daily., Disp: 60 tablet, Rfl: 0   Multiple Vitamin (MULTIVITAMIN ADULT PO), Take 1 tablet by mouth., Disp: , Rfl:    nitroGLYCERIN (NITROSTAT) 0.4 MG SL tablet, Place 1 tablet (0.4 mg total) under the tongue every 5 (five) minutes x 3 doses as needed for chest pain., Disp: 25 tablet, Rfl: 0   psyllium (REGULOID) 0.52 g capsule, Take 0.52 g by mouth daily., Disp: , Rfl:    traZODone (DESYREL) 100 MG tablet, Take 50-100 mg by mouth at bedtime as needed., Disp: , Rfl:    triamcinolone ointment (KENALOG) 0.1 %, Apply twice daily for flare ups below neck, maximum 10 days., Disp: 453.6 g, Rfl: 1  Past Medical History: Past Medical History:  Diagnosis Date   Diabetes mellitus without complication (HCC)    Hypertension     Tobacco Use: Social History   Tobacco Use  Smoking Status Every Day   Current packs/day: 0.50   Types: Cigarettes   Passive exposure: Never  Smokeless Tobacco Never    Labs: Review Flowsheet       Latest Ref Rng & Units 11/03/2022 11/04/2022  Labs for ITP Cardiac and Pulmonary Rehab  Cholestrol 0 - 200 mg/dL - 034   LDL (calc) 0 - 99 mg/dL - 742   HDL-C >59 mg/dL - 44   Trlycerides <563 mg/dL - 875   Hemoglobin I4P  4.8 - 5.6 % 6.2  -    Details            Capillary Blood Glucose: Lab Results  Component Value Date   GLUCAP 112 (H) 11/23/2022   GLUCAP 94 11/23/2022   GLUCAP 89 11/20/2022   GLUCAP 105 (H) 11/20/2022   GLUCAP 118 (H) 11/18/2022     Exercise Target Goals: Exercise Program Goal: Individual exercise prescription set using results from initial 6 min walk test and THRR while considering  patient's activity barriers and safety.   Exercise Prescription Goal: Starting with aerobic activity 30 plus minutes a day, 3 days per week for initial exercise prescription. Provide home exercise prescription and guidelines that participant acknowledges understanding prior to discharge.  Activity Barriers & Risk  Stratification:  Activity Barriers & Cardiac Risk Stratification - 11/17/22 0801       Activity Barriers & Cardiac Risk Stratification   Activity Barriers Joint Problems;Deconditioning;Muscular Weakness   previous ankle injury, L shoulder impingement   Cardiac Risk Stratification Moderate             6 Minute Walk:  6 Minute Walk     Row Name 11/17/22 0929 01/08/23 1415       6 Minute Walk   Phase Initial Discharge    Distance 1300 feet 200 feet    Distance % Change -- 53.8 %    Distance Feet Change -- 700 ft    Walk Time 6 minutes 6 minutes    # of Rest Breaks 0 0    MPH 2.46 3.79    METS 3.59 4.59    RPE 11 9    VO2 Peak 12.57 16.06    Symptoms No No    Resting HR 77 bpm 55 bpm    Resting BP 126/64 106/60    Resting Oxygen Saturation  96 % --    Exercise Oxygen Saturation  during 6 min walk 97 % --    Max Ex. HR 101 bpm 84 bpm    Max Ex. BP 134/72 124/70    2 Minute Post BP 124/62 --             Oxygen Initial Assessment:   Oxygen Re-Evaluation:   Oxygen Discharge (Final Oxygen Re-Evaluation):   Initial Exercise Prescription:  Initial Exercise Prescription - 11/17/22 0900       Date of Initial Exercise RX and Referring Provider   Date 11/17/22    Referring Provider Ramiro Harvest MD   Attending Cardiologist: Dr. Dina Rich   Expected Discharge Date 02/12/23   Medicaid     Oxygen   Maintain Oxygen Saturation 88% or higher      Treadmill   MPH 2.5    Grade 1    Minutes 15    METs 3.26      REL-XR   Level 4    Speed 50    Minutes 15    METs 3.2      Prescription Details   Frequency (times per week) 3    Duration Progress to 30 minutes of continuous aerobic without signs/symptoms of physical distress      Intensity   THRR 40-80% of Max Heartrate 105-142    Ratings of Perceived Exertion 11-13    Perceived Dyspnea 0-4      Progression   Progression Continue to progress workloads to maintain intensity without signs/symptoms of  physical distress.      Resistance Training   Training Prescription Yes    Weight  4 lb    Reps 10-15             Perform Capillary Blood Glucose checks as needed.  Exercise Prescription Changes:   Exercise Prescription Changes     Row Name 11/17/22 0900 11/18/22 1200 11/27/22 1100 12/11/22 1500 12/28/22 1200     Response to Exercise   Blood Pressure (Admit) 126/64 120/60 -- 124/60 106/60   Blood Pressure (Exercise) 134/72 130/60 -- -- --   Blood Pressure (Exit) 124/64 102/60 -- 114/62 106/60   Heart Rate (Admit) 77 bpm 79 bpm -- 71 bpm 58 bpm   Heart Rate (Exercise) 101 bpm 109 bpm -- 106 bpm 107 bpm   Heart Rate (Exit) 69 bpm 95 bpm -- 83 bpm 66 bpm   Oxygen Saturation (Admit) 96 % -- -- -- --   Oxygen Saturation (Exercise) 97 % -- -- -- --   Rating of Perceived Exertion (Exercise) 11 12 -- 12 13   Symptoms none -- -- -- --   Comments walk test results -- -- -- --   Duration -- Continue with 30 min of aerobic exercise without signs/symptoms of physical distress. -- Continue with 30 min of aerobic exercise without signs/symptoms of physical distress. Continue with 30 min of aerobic exercise without signs/symptoms of physical distress.   Intensity -- THRR unchanged -- THRR unchanged THRR unchanged     Progression   Progression -- Continue to progress workloads to maintain intensity without signs/symptoms of physical distress. -- Continue to progress workloads to maintain intensity without signs/symptoms of physical distress. Continue to progress workloads to maintain intensity without signs/symptoms of physical distress.     Resistance Training   Training Prescription -- Yes -- Yes Yes   Weight -- 3 -- 4 4 lbs   Reps -- 10-15 -- 10-15 10-15     Treadmill   MPH -- 2.7 -- 2.8 2.9   Grade -- 0 -- 1 2   Minutes -- 15 -- 15 15   METs -- 3.07 -- 3.53 4.02     REL-XR   Level -- 3 -- 3 3   Speed -- 74 -- 64 70   Minutes -- 15 -- 15 15   METs -- 5.9 -- 5.9 6.3     Home  Exercise Plan   Plans to continue exercise at -- -- [employer] (comment)  Art[employer] (home: TM and rower) [employer] (comment) [employer] (comment)   Frequency -- -- Add 2 additional days to program exercise sessions. Add 2 additional days to program exercise sessions. Add 2 additional days to program exercise sessions.   Initial Home Exercises Provided -- -- 11/27/22 -- --     Oxygen   Maintain Oxygen Saturation -- 88% or higher -- 88% or higher 88% or higher            Exercise Comments:   Exercise Goals and Review:   Exercise Goals     Row Name 11/17/22 0932             Exercise Goals   Increase Physical Activity Yes       Intervention Provide advice, education, support and counseling about physical activity/exercise needs.;Develop an individualized exercise prescription for aerobic and resistive training based on initial evaluation findings, risk stratification, comorbidities and participant's personal goals.       Expected Outcomes Short Term: Attend rehab on a regular basis to increase amount of physical activity.;Long Term: Add in home exercise to make exercise part  of routine and to increase amount of physical activity.;Long Term: Exercising regularly at least 3-5 days a week.       Increase Strength and Stamina Yes       Intervention Provide advice, education, support and counseling about physical activity/exercise needs.;Develop an individualized exercise prescription for aerobic and resistive training based on initial evaluation findings, risk stratification, comorbidities and participant's personal goals.       Expected Outcomes Short Term: Increase workloads from initial exercise prescription for resistance, speed, and METs.;Short Term: Perform resistance training exercises routinely during rehab and add in resistance training at home;Long Term: Improve cardiorespiratory fitness, muscular endurance and strength as measured by increased METs  and functional capacity ( )       Able to understand and use rate of perceived exertion (RPE) scale Yes       Intervention Provide education and explanation on how to use RPE scale       Expected Outcomes Short Term: Able to use RPE daily in rehab to express subjective intensity level;Long Term:  Able to use RPE to guide intensity level when exercising independently       Able to understand and use Dyspnea scale Yes       Intervention Provide education and explanation on how to use Dyspnea scale       Expected Outcomes Short Term: Able to use Dyspnea scale daily in rehab to express subjective sense of shortness of breath during exertion;Long Term: Able to use Dyspnea scale to guide intensity level when exercising independently       Knowledge and understanding of Target Heart Rate Range (THRR) Yes       Intervention Provide education and explanation of THRR including how the numbers were predicted and where they are located for reference       Expected Outcomes Short Term: Able to state/look up THRR;Long Term: Able to use THRR to govern intensity when exercising independently;Short Term: Able to use daily as guideline for intensity in rehab       Able to check pulse independently Yes       Intervention Provide education and demonstration on how to check pulse in carotid and radial arteries.;Review the importance of being able to check your own pulse for safety during independent exercise       Expected Outcomes Short Term: Able to explain why pulse checking is important during independent exercise;Long Term: Able to check pulse independently and accurately       Understanding of Exercise Prescription Yes       Intervention Provide education, explanation, and written materials on patient's individual exercise prescription       Expected Outcomes Short Term: Able to explain program exercise prescription;Long Term: Able to explain home exercise prescription to exercise independently                 Exercise Goals Re-Evaluation :  Exercise Goals Re-Evaluation     Row Name 11/17/22 0933 11/19/22 0917 11/27/22 1120 12/02/22 1117 12/28/22 1104     Exercise Goal Re-Evaluation   Exercise Goals Review Able to understand and use rate of perceived exertion (RPE) scale;Able to understand and use Dyspnea scale Increase Physical Activity;Understanding of Exercise Prescription;Increase Strength and Stamina Increase Physical Activity;Increase Strength and Stamina;Able to understand and use rate of perceived exertion (RPE) scale;Able to understand and use Dyspnea scale;Knowledge and understanding of Target Heart Rate Range (THRR);Able to check pulse independently;Understanding of Exercise Prescription Increase Physical Activity;Increase Strength and Stamina;Understanding of Exercise Prescription (P)  Increase Physical Activity;Increase Strength and Stamina;Understanding of Exercise Prescription   Comments Reviewed RPE  and dyspnea scale, and program prescription with pt today.  Pt voiced understanding and was given a copy of goals to take home. Donna had her first session of cardiac rehab. She did great with her first ecercise and was working at an [employer] of 12 for both sets. Will continue to monitor and progress as able Shannia is doing well in rehab.  She is feeling good and steadily increasing her workloads.  She is eager to get back to gym as well.  Reviewed home exercise with pt today.  Pt plans to walk and use treadmill and rower at home for exercise.  She also belongs to [employer] and wants to go back.  Reviewed THR, pulse, RPE, sign and symptoms, pulse oximetery and when to call 911 or MD.  Also discussed weather considerations and indoor options.  Pt voiced understanding. Larita Fife (P)  Runa is doing great in rehab. She still keeps improving her endurance and stamin. She is ready to get back to the gym and will go 5 days a week. She stated that she does a lot more at the gym and is ready to get back after  rehab. She is increaing her workloads in rehab everyother week.   Expected Outcomes Short: Use RPE daily to regulate intensity.  Long: Follow program prescription Short term: continue to working at her workload until RPE is 11 then increase worklaod   long term goal: continue to attend caridac rehab Short: Start to go back to gym Long: Continue to improve strength -- Short: Start to go back to gym Long: Continue to improve strength    Row Name 12/29/22 0944             Exercise Goal Re-Evaluation   Exercise Goals Review Increase Physical Activity;Increase Strength and Stamina;Understanding of Exercise Prescription       Comments Cele is tolerating exercise very well. She was going to the gym before rehab and is ready to get back. She is increasing her worklaods each week. She is at level 3.0 on the XR and a 2.9 speed with 2.0 grade on the treadmill. Will continue to montior and progress as able.       Expected Outcomes Short term: continue to exerise at currently levels   long term: continue to attend rehab                 Discharge Exercise Prescription (Final Exercise Prescription Changes):  Exercise Prescription Changes - 12/28/22 1200       Response to Exercise   Blood Pressure (Admit) 106/60    Blood Pressure (Exit) 106/60    Heart Rate (Admit) 58 bpm    Heart Rate (Exercise) 107 bpm    Heart Rate (Exit) 66 bpm    Rating of Perceived Exertion (Exercise) 13    Duration Continue with 30 min of aerobic exercise without signs/symptoms of physical distress.    Intensity THRR unchanged      Progression   Progression Continue to progress workloads to maintain intensity without signs/symptoms of physical distress.      Resistance Training   Training Prescription Yes    Weight 4 lbs    Reps 10-15      Treadmill   MPH 2.9    Grade 2    Minutes 15    METs 4.02      REL-XR   Level 3    Speed 70  Minutes 15    METs 6.3      Home Exercise Plan   Plans to continue  exercise at [facility] (comment)    Frequency Add 2 additional days to program exercise sessions.      Oxygen   Maintain Oxygen Saturation 88% or higher             Nutrition:  Target Goals: Understanding of nutrition guidelines, daily intake of sodium 1500mg , cholesterol 200mg , calories 30% from fat and 7% or less from saturated fats, daily to have 5 or more servings of fruits and vegetables.  Biometrics:  Pre Biometrics - 11/17/22 0933       Pre Biometrics   Height 5' 6.5" (1.689 m)    Weight 159 lb 3.2 oz (72.2 kg)    Waist Circumference 34.25 inches    Hip Circumference 38 inches    Waist to Hip Ratio 0.9 %    BMI (Calculated) 25.31    Grip Strength 30 kg    Single Leg Stand 30 seconds             Post Biometrics - 01/08/23 1417        Post  Biometrics   Height 5' 6.5" (1.689 m)    Weight 158 lb 11.2 oz (72 kg)    Waist Circumference 34 inches    Hip Circumference 38 inches    Waist to Hip Ratio 0.89 %    BMI (Calculated) 25.23    Grip Strength 27.9 kg    Single Leg Stand 30 seconds             Nutrition Therapy Plan and Nutrition Goals:  Nutrition Therapy & Goals - 11/17/22 0941       Intervention Plan   Intervention Prescribe, educate and counsel regarding individualized specific dietary modifications aiming towards targeted core components such as weight, hypertension, lipid management, diabetes, heart failure and other comorbidities.;Nutrition handout(s) given to patient.    Expected Outcomes Short Term Goal: Understand basic principles of dietary content, such as calories, fat, sodium, cholesterol and nutrients.;Long Term Goal: Adherence to prescribed nutrition plan.             Nutrition Assessments:  MEDIFICTS Score Key: >=70 Need to make dietary changes  40-70 Heart Healthy Diet <= 40 Therapeutic Level Cholesterol Diet  Flowsheet Row CARDIAC REHAB PHASE II ORIENTATION from 11/17/2022 in [facility] CARDIAC REHABILITATION   Picture Your Plate Total Score on Admission 68      Picture Your Plate Scores: <16 Unhealthy dietary pattern with much room for improvement. 41-50 Dietary pattern unlikely to meet recommendations for good health and room for improvement. 51-60 More healthful dietary pattern, with some room for improvement.  >60 Healthy dietary pattern, although there may be some specific behaviors that could be improved.    Nutrition Goals Re-Evaluation:  Nutrition Goals Re-Evaluation     Row Name 11/27/22 1123 12/28/22 1111           Goals   Nutrition Goal Heart Healhty Diet Heart Healhty Diet      Comment Bethzaida is doing well in rehab.  She has been trying to eat better and making better choices.  She has found that she now reaches for a piece of fruit when she wants a snack. She is trying to adopt changes but is limited by what her mom will also eat.  She feels that she has a good grasp on her diet and does limit her sodium intake as well. Larita Fife  is doing good in rehab. She continues to try to eat better and making better choice. She continues to go for healthier options now. She is still limited by what her mom can eat but feels she is doing good with picking healthjy options and limiting her sodium      Expected Outcome Short: continue to add in more fruit and vegetables Long: continue to focus on healthy eating Short: continue to add in more fruit and vegetables Long: continue to focus on healthy eating               Nutrition Goals Discharge (Final Nutrition Goals Re-Evaluation):  Nutrition Goals Re-Evaluation - 12/28/22 1111       Goals   Nutrition Goal Heart Healhty Diet    Comment Yarethzy is doing good in rehab. She continues to try to eat better and making better choice. She continues to go for healthier options now. She is still limited by what her mom can eat but feels she is doing good with picking healthjy options and limiting her sodium    Expected Outcome Short: continue to add in  more fruit and vegetables Long: continue to focus on healthy eating             Psychosocial: Target Goals: Acknowledge presence or absence of significant depression and/or stress, maximize coping skills, provide positive support system. Participant is able to verbalize types and ability to use techniques and skills needed for reducing stress and depression.  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  Using trazodone for sleep. Using smoking for anxiety.      Family Dynamics   Good Support System? Yes      Barriers   Psychosocial barriers to participate in program The patient should benefit from training in stress management and relaxation.;Psychosocial barriers identified (see note)      Screening Interventions   Interventions Encouraged to exercise;To provide support and resources with identified psychosocial needs;Provide feedback about the scores to participant    Expected Outcomes Short Term goal: Utilizing psychosocial counselor, staff and physician to assist with identification of specific Stressors or current issues interfering with healing process. Setting desired goal for each stressor or current issue identified.;Long Term Goal: Stressors or current issues are controlled or eliminated.;Short Term goal: Identification and review with participant of any Quality of Life or Depression concerns found by scoring the questionnaire.;Long Term goal: The participant improves quality of Life and PHQ9 Scores as seen by post scores and/or verbalization of changes             Quality of Life Scores:  Quality of Life - 11/17/22 0941       Quality of Life   Select Quality of Life      Quality of Life Scores    Health/Function Pre 24.53 %    Socioeconomic Pre 27.5 %    Psych/Spiritual Pre 22.5 %    Family Pre 28.5 %    GLOBAL Pre 25.14 %            Scores of 19 and below usually indicate a poorer quality of life in these areas.  A difference of  2-3 points is a clinically meaningful difference.  A difference of 2-3 points in the  total score of the Quality of Life Index has been associated with significant improvement in overall quality of life, self-image, physical symptoms, and general health in studies assessing change in quality of life.  PHQ-9: Review Flowsheet       12/28/2022 11/17/2022  Depression screen PHQ 2/9  Decreased Interest 0 1  Down, Depressed, Hopeless 0 0  PHQ - 2 Score 0 1  Altered sleeping 1 0  Tired, decreased energy 0 1  Change in appetite 0 3  Feeling bad or failure about yourself  0 1  Trouble concentrating 1 0  Moving slowly or fidgety/restless 0 0  Suicidal thoughts 0 0  PHQ-9 Score 2 6  Difficult doing work/chores Not difficult at all Somewhat difficult    Details           Interpretation of Total Score  Total Score Depression Severity:  1-4 = Minimal depression, 5-9 = Mild depression, 10-14 = Moderate depression, 15-19 = Moderately severe depression, 20-27 = Severe depression   Psychosocial Evaluation and Intervention:  Psychosocial Evaluation - 11/17/22 0934       Psychosocial Evaluation & Interventions   Interventions Stress management education;Relaxation education;Encouraged to exercise with the program and follow exercise prescription    Comments Markel is coming into cardiac rehab after a NSTEMI and stent.  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  Prior to caring for her mother full time she was active and going to gym regularly.  She would like to get back there again soon.  She is a chronic bad sleeper and uses trazadone at night to help her calm her mind and go to sleep.  She could  benefit from stress education and relaxation techniques.  Her other biggest stress is trying to quit smoking.  She has been a 1/2 ppd for many years.  She is down to 4-5 cigarettes a day and wants to continue to wean. She has talked about it with her doctor and was encouraged to try the patches and gum to help quit.  We also reviewed the quit smoking packet today.  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   She enjoys that a lot.  She is looking forward to program and getting moving again and back to gym.  She has no barriers to getting to rehab and is able to leave her mother to come to rehab.  She is on Medicaid and will have the 12 week limit.    Expected Outcomes Short: Attend rehab to get back to habit of exercise Long: Conitnue to work on quitting smoking             Psychosocial Re-Evaluation:  Psychosocial Re-Evaluation     Row Name 11/27/22 1127 12/28/22 1109           Psychosocial Re-Evaluation   Current issues with Current Stress Concerns Current Stress Concerns      Comments Meesha is doing well in rehab.  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  She and her mom butt heads multiple times a week which is very frustrating for her.  She has been able to get help to come in most mornings to give her a break and to allow her to go out to do errands and get to rehab/exercise.  She says having the help has made a big difference for her.  She is also  working on quitting smoking, which has always been a source of stress relief. She is meeitng with her doctor next week for meds/resources.  We commended her for taking this next step in her health.  She is still sleeping well. Leighana is doing well in rehab. She said her stress with taking care of her mom has been better. Her mom has not been butting heads as much and frustrating her.  She continues to sleep well.      Expected Outcomes Short: Continue to work on quitting smoking Long:  Continue to cope with caretaking and making time for self. Short: Continue to work on quitting smoking Long: Continue to cope with caretaking and making time for self.      Interventions Encouraged to attend Pulmonary Rehabilitation for the exercise Stress management education;Encouraged to attend Cardiac Rehabilitation for the exercise;Relaxation education      Continue Psychosocial Services  Follow up required by staff Follow up required by staff               Psychosocial Discharge (Final Psychosocial Re-Evaluation):  Psychosocial Re-Evaluation - 12/28/22 1109       Psychosocial Re-Evaluation   Current issues with Current Stress Concerns    Comments Judye is doing well in rehab. She said her stress with taking care of her mom has been better. Her mom has not been butting heads as much and frustrating her.  She continues to sleep well.    Expected Outcomes Short: Continue to work on quitting smoking Long: Continue to cope with caretaking and making time for self.    Interventions Stress management education;Encouraged to attend Cardiac Rehabilitation for the exercise;Relaxation education    Continue Psychosocial Services  Follow up required by staff             Vocational Rehabilitation: Provide vocational rehab assistance to qualifying candidates.   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            Education: Education Goals: Education classes will be provided on a weekly basis, covering required topics. Participant will state understanding/return demonstration of topics presented.  Learning Barriers/Preferences:  Learning Barriers/Preferences - 11/17/22 0802       Learning Barriers/Preferences   Learning Barriers Sight   glasses   Learning Preferences Written Material              Education Topics: Hypertension, Hypertension Reduction -Define heart disease and high blood pressure. Discus how high blood pressure affects the body and ways to reduce high blood pressure.   Exercise and Your Heart -Discuss why it is important to exercise, the FITT principles of exercise, normal and abnormal responses to exercise, and how to exercise safely.   Angina -Discuss definition of angina, causes of angina, treatment of angina, and how to decrease risk of having angina. Flowsheet Row CARDIAC REHAB PHASE II EXERCISE from 01/06/2023 in [place] CARDIAC REHABILITATION  Date 12/23/22  Educator [facility]  Instruction Review Code 1- Verbalizes Understanding       Cardiac Medications -Review what the following cardiac medications are used for, how they affect the body, and side effects that may occur when taking the medications.  Medications include Aspirin, Beta blockers, calcium channel blockers, ACE Inhibitors, angiotensin receptor blockers, diuretics, digoxin, and antihyperlipidemics. Flowsheet Row CARDIAC REHAB PHASE II EXERCISE  from 01/06/2023 in [facility]  Date 12/02/22  Educator DJ       Congestive Heart Failure -Discuss the definition of CHF, how to live with CHF, the signs and symptoms of CHF, and how keep track of weight and sodium intake. Flowsheet Row CARDIAC REHAB PHASE II EXERCISE from 01/06/2023 in [place] CARDIAC REHABILITATION  Date 12/16/22  Educator [facility]  Instruction Review Code 2- Demonstrated Understanding       Heart Disease and Intimacy -Discus the effect sexual activity has on the heart, how changes occur during intimacy as we age, and safety during sexual activity. Flowsheet Row CARDIAC REHAB PHASE II EXERCISE from 01/06/2023 in [place] CARDIAC REHABILITATION  Date 01/06/23  Educator HB  Instruction Review Code 1- Verbalizes Understanding       Smoking Cessation / COPD -Discuss different methods to quit  smoking, the health benefits of quitting smoking, and the definition of COPD. Flowsheet Row CARDIAC REHAB PHASE II EXERCISE from 01/06/2023 in [place] CARDIAC REHABILITATION  Date 12/09/22  Educator [facility]  Instruction Review Code 1- Verbalizes Understanding       Nutrition I: Fats -Discuss the types of cholesterol, what cholesterol does to the heart, and how cholesterol levels can be controlled. Flowsheet Row CARDIAC REHAB PHASE II EXERCISE from 11/18/2022 in [place] CARDIAC REHABILITATION  Date 11/18/22  Educator [facility]  Instruction Review Code 1- Verbalizes Understanding       Nutrition II: Labels -Discuss the different components of food labels and how to read food label Flowsheet Row CARDIAC REHAB PHASE II EXERCISE from 11/18/2022 in [place] CARDIAC REHABILITATION  Date 11/18/22  Educator [facility]  Instruction Review Code 1- Verbalizes Understanding       Heart Parts/Heart Disease and PAD -Discuss the anatomy of the heart, the pathway of blood circulation through the heart, and these are affected by heart disease.   Stress I: Signs and Symptoms -Discuss the causes of stress, how stress may lead to anxiety and depression, and ways to limit stress. Flowsheet Row CARDIAC REHAB PHASE II EXERCISE from 01/06/2023 in [place] CARDIAC REHABILITATION  Date 11/25/22  Educator [facility]  Instruction Review Code 2- Demonstrated Understanding       Stress II: Relaxation -Discuss different types of relaxation techniques to limit stress. Flowsheet Row CARDIAC REHAB PHASE II EXERCISE from 01/06/2023 in [place] CARDIAC REHABILITATION  Date 11/25/22  Educator [facility]  Instruction Review Code 2- Demonstrated Understanding       Warning Signs of Stroke / TIA -Discuss definition of a stroke, what the signs and symptoms are of a stroke, and how to identify when someone is having stroke.   Knowledge Questionnaire Score:  Knowledge Questionnaire Score - 11/17/22 0942       Knowledge  Questionnaire Score   Pre Score 23/28             Core Components/Risk Factors/Patient Goals at Admission:  Personal Goals and Risk Factors at Admission - 11/17/22 0942       Core Components/Risk Factors/Patient Goals on Admission    Weight Management Yes;Weight Loss    Intervention Weight Management: Develop a combined nutrition and exercise program designed to reach desired caloric intake, while maintaining appropriate intake of nutrient and fiber, sodium and fats, and appropriate energy expenditure required for the weight goal.;Weight Management: Provide education and appropriate resources to help participant work on and attain dietary goals.    Admit Weight 159 lb 3.2 oz (72.2 kg)    Goal Weight:  Short Term 155 lb (70.3 kg)    Goal Weight: Long Term 155 lb (70.3 kg)    Expected Outcomes Short Term: Continue to assess and modify interventions until short term weight is achieved;Long Term: Adherence to nutrition and physical activity/exercise program aimed toward attainment of established weight goal;Weight Loss: Understanding of general recommendations for a balanced deficit meal plan, which promotes 1-2 lb weight loss per week and includes a negative energy balance of [phone] kcal/d;Understanding recommendations for meals to include 15-35% energy as protein, 25-35% energy from fat, 35-60% energy from carbohydrates, less than 200mg  of dietary cholesterol, 20-35 gm of total fiber daily;Understanding of distribution of calorie intake throughout the day with the consumption of 4-5 meals/snacks;Weight Maintenance: Understanding of the daily nutrition guidelines, which includes 25-35% calories from fat, 7% or less cal from saturated fats, less than 200mg  cholesterol, less than 1.5gm of sodium, & 5 or more servings of fruits and vegetables daily    Tobacco Cessation Yes    Number of packs per day 1/2 pack per day    Intervention Assist the participant in steps to quit. Provide individualized  education and counseling about committing to Tobacco Cessation, relapse prevention, and pharmacological support that can be provided by physician.;Education officer, environmental, assist with locating and accessing local/national Quit Smoking programs, and support quit date choice.    Expected Outcomes Short Term: Will demonstrate readiness to quit, by selecting a quit date.;Short Term: Will quit all tobacco product use, adhering to prevention of relapse plan.;Long Term: Complete abstinence from all tobacco products for at least 12 months from quit date.    Diabetes Yes    Intervention Provide education about signs/symptoms and action to take for hypo/hyperglycemia.;Provide education about proper nutrition, including hydration, and aerobic/resistive exercise prescription along with prescribed medications to achieve blood glucose in normal ranges: Fasting glucose 65-99 mg/dL    Expected Outcomes Short Term: Participant verbalizes understanding of the signs/symptoms and immediate care of hyper/hypoglycemia, proper foot care and importance of medication, aerobic/resistive exercise and nutrition plan for blood glucose control.;Long Term: Attainment of HbA1C < 7%.    Hypertension Yes    Intervention Provide education on lifestyle modifcations including regular physical activity/exercise, weight management, moderate sodium restriction and increased consumption of fresh fruit, vegetables, and low fat dairy, alcohol moderation, and smoking cessation.;Monitor prescription use compliance.    Expected Outcomes Short Term: Continued assessment and intervention until BP is < 140/83mm HG in hypertensive participants. < 130/39mm HG in hypertensive participants with diabetes, heart failure or chronic kidney disease.;Long Term: Maintenance of blood pressure at goal levels.    Lipids Yes    Intervention Provide education and support for participant on nutrition & aerobic/resistive exercise along with prescribed medications to  achieve LDL 70mg , HDL >40mg .    Expected Outcomes Short Term: Participant states understanding of desired cholesterol values and is compliant with medications prescribed. Participant is following exercise prescription and nutrition guidelines.;Long Term: Cholesterol controlled with medications as prescribed, with individualized exercise RX and with personalized nutrition plan. Value goals: LDL < 70mg , HDL > 40 mg.             Core Components/Risk Factors/Patient Goals Review:   Goals and Risk Factor Review     Row Name 11/17/22 0943 11/27/22 1115 12/28/22 1113         Core Components/Risk Factors/Patient Goals Review   Personal Goals Review Tobacco Cessation Weight Management/Obesity;Tobacco Cessation;Hypertension --     Review Gabbrielle is a current tobacco user. Intervention for tobacco cessation  was provided at the initial medical review. She was asked about readiness to quit and reported she is trying to reduce her smoking. Patient was advised and educated about tobacco cessation using combination therapy, tobacco cessation classes, quit line, and quit smoking apps. Patient demonstrated understanding of this material. Staff will continue to provide encouragement and follow up with the patient throughout the program. Kayleena is doing well in rehab. Her weight is steady and her pressures have been good.  Her biggest goal is to work on quitting smoking.  She has made up her mind to quit.  She is meeting with her PCP next week to talk about adding in Wellbutrin or Chantix to help.  She has tried patches before but was not successful. We talked about using all her options in unison to get an improved benefit.  She has not tried lozenges or gum before so we talked through those options as well. Nohelia has been taking Chantix to help with smoking and has not had anything for 6 days. She goes back to her PCP for a check in with quiting and her medication is running out to see what the next step for quititng is.  She is hoping to refill the medication to continue to help with quiting smoking. She continues to have a steady weight and her blood pressure continues to be good.     Expected Outcomes Short: Try patches with gum/lonzenge Long: Set potential quit date Shrot: Meet with MD about quitting smoking Long: Set quit date and keep monitoring risk factors. Short: coninue to focus on quitting smoking   long term: Continue to montior weight and BP and exercise              Core Components/Risk Factors/Patient Goals at Discharge (Final Review):   Goals and Risk Factor Review - 12/28/22 1113       Core Components/Risk Factors/Patient Goals Review   Review Jaya has been taking Chantix to help with smoking and has not had anything for 6 days. She goes back to her PCP for a check in with quiting and her medication is running out to see what the next step for quititng is. She is hoping to refill the medication to continue to help with quiting smoking. She continues to have a steady weight and her blood pressure continues to be good.    Expected Outcomes Short: coninue to focus on quitting smoking   long term: Continue to montior weight and BP and exercise             ITP Comments:  ITP Comments     Row Name 11/16/22 1112 11/17/22 0927 11/18/22 0831 12/16/22 0824 01/13/23 0846   ITP Comments Virtual orientation completed.  Diagnosis documentation found in [facility] 11-05-22 .  Orientation schedule 11-17-22 8am. Patient attend orientation today.  Patient is attendingCardiac Rehabilitation Program.  Documentation for diagnosis [facility] be found in [facility] 11/03/22.  Reviewed medical chart, RPE/RPD, gym safety, and program guidelines.  Patient was fitted to equipment they will be using during rehab.  Patient is scheduled to start exercise on 11/18/22 at 1100.   Initial ITP created and sent for review and signature by Dr. Dina Rich, Medical Director for Cardiac Rehabilitation Program. 30 day review completed. ITP sent to Dr.  Dina Rich, Medical Director of Cardiac Rehab. Continue with ITP unless changes are made by physician.  Only attended orinetaion thus far 30 day review completed. ITP sent to Dr. Dina Rich, Medical Director of Cardiac Rehab. Continue  with ITP unless changes are made by physician. 30 day review completed. ITP sent to Dr. Dina Rich, Medical Director of Cardiac Rehab. Continue with ITP unless changes are made by physician.            Comments: 30 day review

## 2023-01-15 ENCOUNTER — Encounter (HOSPITAL_COMMUNITY)
Admission: RE | Admit: 2023-01-15 | Discharge: 2023-01-15 | Disposition: A | Discharge status: Home / Self Care | Payer: Medicaid Other | Source: Ambulatory Visit | Attending: Cardiology | Admitting: Cardiology

## 2023-01-15 DIAGNOSIS — I214 Non-ST elevation (NSTEMI) myocardial infarction: Secondary | ICD-10-CM | POA: Diagnosis not present

## 2023-01-15 DIAGNOSIS — Z955 Presence of coronary angioplasty implant and graft: Secondary | ICD-10-CM

## 2023-01-15 NOTE — Progress Notes (Addendum)
Daily Session Note  Patient Details  Name: [name] MRN: [id number] Date of Birth: [birth year] Referring Provider:   Flowsheet Row CARDIAC REHAB PHASE II ORIENTATION from 11/17/2022 in [facility] CARDIAC REHABILITATION  Referring Provider Ramiro Harvest MD  [Attending Cardiologist: Dr. Dina Rich       Encounter Date: 01/15/2023  Check In:  Session Check In - 01/15/23 1045       Check-In   Supervising physician immediately available to respond to emergencies See telemetry face sheet for immediately available MD    Location [facility]    Staff Present Ross Ludwig, BS, Exercise Physiologist;Madell Heino BSN, RN;Other    Virtual Visit No    Medication changes reported     No    Fall or balance concerns reported    No    Tobacco Cessation No Change    Warm-up and Cool-down Performed on first and last piece of equipment    Resistance Training Performed Yes    VAD Patient? No    PAD/SET Patient? No      Pain Assessment   Currently in Pain? No/denies    Pain Score 0-No pain    Multiple Pain Sites No             Capillary Blood Glucose: No results found for this or any previous visit (from the past 24 hour(s)).    Social History   Tobacco Use  Smoking Status Every Day   Current packs/day: 0.50   Types: Cigarettes   Passive exposure: Never  Smokeless Tobacco Never    Goals Met:  Independence with exercise equipment Exercise tolerated well No report of concerns or symptoms today Strength training completed today  Goals Unmet:  Not Applicable  Comments:  [name] graduated today from  rehab with 33 sessions completed.  Details of the patient's exercise prescription and what She needs to do in order to continue the prescription and progress were discussed with patient.  Patient was given a copy of prescription and goals.  Patient verbalized understanding. [name] plans to continue to exercise by going to the community center to  exercise .

## 2023-01-18 ENCOUNTER — Encounter (HOSPITAL_COMMUNITY): Payer: Medicaid Other

## 2023-01-19 NOTE — Progress Notes (Signed)
Discharge Progress Report  Patient Details  Name: [name] MRN: [id number] Date of Birth: Oct 20, 1960 Referring Provider:   Flowsheet Row CARDIAC REHAB PHASE II ORIENTATION from 11/17/2022 in [facility] CARDIAC REHABILITATION  Referring Provider Ramiro Harvest MD  [Attending Cardiologist: Dr. Dina Rich      Number of Visits: 33  Reason for Discharge:  Patient reached a stable level of exercise. Patient independent in their exercise. Patient has met program and personal goals.  Smoking History:  Social History   Tobacco Use  Smoking Status Every Day   Current packs/day: 0.50   Types: Cigarettes   Passive exposure: Never  Smokeless Tobacco Never    Diagnosis:  NSTEMI (non-ST elevated myocardial infarction) (HCC)  Status post coronary artery stent placement  Initial Exercise Prescription:  Initial Exercise Prescription - 11/17/22 0900       Date of Initial Exercise RX and Referring Provider   Date 11/17/22    Referring Provider Ramiro Harvest MD   Attending Cardiologist: Dr. Dina Rich   Expected Discharge Date 02/12/23   Medicaid     Oxygen   Maintain Oxygen Saturation 88% or higher      Treadmill   MPH 2.5    Grade 1    Minutes 15    METs 3.26      REL-XR   Level 4    Speed 50    Minutes 15    METs 3.2      Prescription Details   Frequency (times per week) 3    Duration Progress to 30 minutes of continuous aerobic without signs/symptoms of physical distress      Intensity   THRR 40-80% of Max Heartrate 105-142    Ratings of Perceived Exertion 11-13    Perceived Dyspnea 0-4      Progression   Progression Continue to progress workloads to maintain intensity without signs/symptoms of physical distress.      Resistance Training   Training Prescription Yes    Weight 4 lb    Reps 10-15             Discharge Exercise Prescription (Final Exercise Prescription Changes):  Exercise Prescription Changes - 01/13/23 1300        Response to Exercise   Blood Pressure (Admit) 108/60    Blood Pressure (Exit) 102/60    Heart Rate (Admit) 88 bpm    Heart Rate (Exercise) 118 bpm    Heart Rate (Exit) 72 bpm    Rating of Perceived Exertion (Exercise) 12    Duration Continue with 30 min of aerobic exercise without signs/symptoms of physical distress.    Intensity THRR unchanged      Progression   Progression Continue to progress workloads to maintain intensity without signs/symptoms of physical distress.      Resistance Training   Training Prescription Yes    Weight 4 lbs    Reps 10-15      Treadmill   MPH 3    Grade 3    Minutes 15    METs 4.54      REL-XR   Level 4    Speed 63    Minutes 15    METs 6.6      Home Exercise Plan   Plans to continue exercise at [facility] (comment)    Frequency Add 2 additional days to program exercise sessions.      Oxygen   Maintain Oxygen Saturation 88% or higher  Functional Capacity:  6 Minute Walk     Row Name 11/17/22 0929 01/08/23 1415       6 Minute Walk   Phase Initial Discharge    Distance 1300 feet 200 feet    Distance % Change -- 53.8 %    Distance Feet Change -- 700 ft    Walk Time 6 minutes 6 minutes    # of Rest Breaks 0 0    MPH 2.46 3.79    METS 3.59 4.59    RPE 11 9    VO2 Peak 12.57 16.06    Symptoms No No    Resting HR 77 bpm 55 bpm    Resting BP 126/64 106/60    Resting Oxygen Saturation  96 % --    Exercise Oxygen Saturation  during 6 min walk 97 % --    Max Ex. HR 101 bpm 84 bpm    Max Ex. BP 134/72 124/70    2 Minute Post BP 124/62 --             Psychological, QOL, Others - Outcomes: PHQ 2/9:    01/19/2023   12:40 PM 12/28/2022   11:25 AM 11/17/2022    9:45 AM  Depression screen PHQ 2/9  Decreased Interest 0 0 1  Down, Depressed, Hopeless 0 0 0  PHQ - 2 Score 0 0 1  Altered sleeping 1 1 0  Tired, decreased energy 1 0 1  Change in appetite 2 0 3  Feeling bad or failure about yourself  0 0 1   Trouble concentrating 0 1 0  Moving slowly or fidgety/restless 0 0 0  Suicidal thoughts 0 0 0  PHQ-9 Score 4 2 6   Difficult doing work/chores Not difficult at all Not difficult at all Somewhat difficult    Quality of Life:  Quality of Life - 01/19/23 1237       Quality of Life   Select Quality of Life      Quality of Life Scores   Health/Function Pre 24.53 %    Health/Function Post 22.83 %    Health/Function % Change -6.93 %    Socioeconomic Pre 27.5 %    Socioeconomic Post 22.5 %    Socioeconomic % Change  -18.18 %    Psych/Spiritual Pre 22.5 %    Psych/Spiritual Post 22.5 %    Psych/Spiritual % Change 0 %    Family Pre 28.5 %    Family Post 22.5 %    Family % Change -21.05 %    GLOBAL Pre 25.14 %    GLOBAL Post 22.64 %    GLOBAL % Change -9.94 %            Nutrition & Weight - Outcomes:  Pre Biometrics - 11/17/22 0933       Pre Biometrics   Height 5' 6.5" (1.689 m)    Weight 72.2 kg    Waist Circumference 34.25 inches    Hip Circumference 38 inches    Waist to Hip Ratio 0.9 %    BMI (Calculated) 25.31    Grip Strength 30 kg    Single Leg Stand 30 seconds             Post Biometrics - 01/08/23 1417        Post  Biometrics   Height 5' 6.5" (1.689 m)    Weight 72 kg    Waist Circumference 34 inches    Hip Circumference 38 inches    Waist  to Hip Ratio 0.89 %    BMI (Calculated) 25.23    Grip Strength 27.9 kg    Single Leg Stand 30 seconds            Education Questionnaire Score:  Knowledge Questionnaire Score - 01/19/23 1237       Knowledge Questionnaire Score   Pre Score 23/28    Post Score 23/28

## 2023-01-19 NOTE — Progress Notes (Signed)
Cardiac Individual Treatment Plan  Patient Details  Name: [name] MRN: [id number] Date of Birth: February 05, 1961 Referring Provider:   Flowsheet Row CARDIAC REHAB PHASE II ORIENTATION from 11/17/2022 in [facility] CARDIAC REHABILITATION  Referring Provider Ramiro Harvest MD  [Attending Cardiologist: Dr. Dina Rich       Initial Encounter Date:  Flowsheet Row CARDIAC REHAB PHASE II ORIENTATION from 11/17/2022 in [facility] [place] CARDIAC REHABILITATION  Date 11/17/22       Visit Diagnosis: NSTEMI (non-ST elevated myocardial infarction) [facility])  Status post coronary artery stent placement  Patient's Home Medications on Admission:  Current Outpatient Medications:    aspirin EC 81 MG tablet, Take 1 tablet (81 mg total) by mouth daily. Swallow whole., Disp: 120 tablet, Rfl: 0   atorvastatin (LIPITOR) 40 MG tablet, Take 1 tablet (40 mg total) by mouth every evening., Disp: 90 tablet, Rfl: 0   Calcium Carbonate Antacid (ANTACID PO), Take 1 tablet by mouth daily., Disp: , Rfl:    cetirizine (ZYRTEC) 10 MG tablet, Take 10 mg by mouth daily., Disp: , Rfl:    clopidogrel (PLAVIX) 75 MG tablet, Take 1 tablet (75 mg total) by mouth daily., Disp: 90 tablet, Rfl: 0   desonide (DESOWEN) 0.05 % cream, Use twice daily for flare ups on face, maximum 10 days., Disp: 60 g, Rfl: 5   diphenhydrAMINE (BENADRYL ALLERGY) 25 MG tablet, Take 50 mg by mouth 2 (two) times daily. PRN, Disp: , Rfl:    EPINEPHrine 0.3 mg/0.3 mL IJ SOAJ injection, Inject into the muscle., Disp: , Rfl:    hydrochlorothiazide (HYDRODIURIL) 12.5 MG tablet, Take 12.5 mg by mouth daily., Disp: , Rfl:    hydrOXYzine (ATARAX) 25 MG tablet, Take 25 mg by mouth 4 (four) times daily as needed., Disp: , Rfl:    KRILL OIL PO, Take 2 tablets by mouth 2 (two) times daily., Disp: , Rfl:    metFORMIN (GLUCOPHAGE) 1000 MG tablet, Take 1,000 mg by mouth 2 (two) times daily., Disp: , Rfl:    metoprolol succinate (TOPROL-XL) 25 MG 24 hr tablet, Take  0.5 tablets (12.5 mg total) by mouth daily., Disp: 60 tablet, Rfl: 0   Multiple Vitamin (MULTIVITAMIN ADULT PO), Take 1 tablet by mouth., Disp: , Rfl:    nitroGLYCERIN (NITROSTAT) 0.4 MG SL tablet, Place 1 tablet (0.4 mg total) under the tongue every 5 (five) minutes x 3 doses as needed for chest pain., Disp: 25 tablet, Rfl: 0   psyllium (REGULOID) 0.52 g capsule, Take 0.52 g by mouth daily., Disp: , Rfl:    traZODone (DESYREL) 100 MG tablet, Take 50-100 mg by mouth at bedtime as needed., Disp: , Rfl:    triamcinolone ointment (KENALOG) 0.1 %, Apply twice daily for flare ups below neck, maximum 10 days., Disp: 453.6 g, Rfl: 1  Past Medical History: Past Medical History:  Diagnosis Date   Diabetes mellitus without complication (HCC)    Hypertension     Tobacco Use: Social History   Tobacco Use  Smoking Status Every Day   Current packs/day: 0.50   Types: Cigarettes   Passive exposure: Never  Smokeless Tobacco Never    Labs: Review Flowsheet       Latest Ref Rng & Units 11/03/2022 11/04/2022  Labs for ITP Cardiac and Pulmonary Rehab  Cholestrol 0 - 200 mg/dL - 295   LDL (calc) 0 - 99 mg/dL - 284   HDL-C >13 mg/dL - 44   Trlycerides <244 mg/dL - 010   Hemoglobin U7O  4.8 - 5.6 % 6.2  -    Details            Capillary Blood Glucose: Lab Results  Component Value Date   GLUCAP 112 (H) 11/23/2022   GLUCAP 94 11/23/2022   GLUCAP 89 11/20/2022   GLUCAP 105 (H) 11/20/2022   GLUCAP 118 (H) 11/18/2022     Exercise Target Goals: Exercise Program Goal: Individual exercise prescription set using results from initial 6 min walk test and THRR while considering  patient's activity barriers and safety.   Exercise Prescription Goal: Starting with aerobic activity 30 plus minutes a day, 3 days per week for initial exercise prescription. Provide home exercise prescription and guidelines that participant acknowledges understanding prior to discharge.  Activity Barriers & Risk  Stratification:  Activity Barriers & Cardiac Risk Stratification - 11/17/22 0801       Activity Barriers & Cardiac Risk Stratification   Activity Barriers Joint Problems;Deconditioning;Muscular Weakness   previous ankle injury, L shoulder impingement   Cardiac Risk Stratification Moderate             6 Minute Walk:  6 Minute Walk     Row Name 11/17/22 0929 01/08/23 1415       6 Minute Walk   Phase Initial Discharge    Distance 1300 feet 200 feet    Distance % Change -- 53.8 %    Distance Feet Change -- 700 ft    Walk Time 6 minutes 6 minutes    # of Rest Breaks 0 0    MPH 2.46 3.79    METS 3.59 4.59    RPE 11 9    VO2 Peak 12.57 16.06    Symptoms No No    Resting HR 77 bpm 55 bpm    Resting BP 126/64 106/60    Resting Oxygen Saturation  96 % --    Exercise Oxygen Saturation  during 6 min walk 97 % --    Max Ex. HR 101 bpm 84 bpm    Max Ex. BP 134/72 124/70    2 Minute Post BP 124/62 --             Oxygen Initial Assessment:   Oxygen Re-Evaluation:   Oxygen Discharge (Final Oxygen Re-Evaluation):   Initial Exercise Prescription:  Initial Exercise Prescription - 11/17/22 0900       Date of Initial Exercise RX and Referring Provider   Date 11/17/22    Referring Provider Ramiro Harvest MD   Attending Cardiologist: Dr. Dina Rich   Expected Discharge Date 02/12/23   Medicaid     Oxygen   Maintain Oxygen Saturation 88% or higher      Treadmill   MPH 2.5    Grade 1    Minutes 15    METs 3.26      REL-XR   Level 4    Speed 50    Minutes 15    METs 3.2      Prescription Details   Frequency (times per week) 3    Duration Progress to 30 minutes of continuous aerobic without signs/symptoms of physical distress      Intensity   THRR 40-80% of Max Heartrate 105-142    Ratings of Perceived Exertion 11-13    Perceived Dyspnea 0-4      Progression   Progression Continue to progress workloads to maintain intensity without signs/symptoms of  physical distress.      Resistance Training   Training Prescription Yes    Weight  4 lb    Reps 10-15             Perform Capillary Blood Glucose checks as needed.  Exercise Prescription Changes:   Exercise Prescription Changes     Row Name 11/17/22 0900 11/18/22 1200 11/27/22 1100 12/11/22 1500 12/28/22 1200     Response to Exercise   Blood Pressure (Admit) 126/64 120/60 -- 124/60 106/60   Blood Pressure (Exercise) 134/72 130/60 -- -- --   Blood Pressure (Exit) 124/64 102/60 -- 114/62 106/60   Heart Rate (Admit) 77 bpm 79 bpm -- 71 bpm 58 bpm   Heart Rate (Exercise) 101 bpm 109 bpm -- 106 bpm 107 bpm   Heart Rate (Exit) 69 bpm 95 bpm -- 83 bpm 66 bpm   Oxygen Saturation (Admit) 96 % -- -- -- --   Oxygen Saturation (Exercise) 97 % -- -- -- --   Rating of Perceived Exertion (Exercise) 11 12 -- 12 13   Symptoms none -- -- -- --   Comments walk test results -- -- -- --   Duration -- Continue with 30 min of aerobic exercise without signs/symptoms of physical distress. -- Continue with 30 min of aerobic exercise without signs/symptoms of physical distress. Continue with 30 min of aerobic exercise without signs/symptoms of physical distress.   Intensity -- THRR unchanged -- THRR unchanged THRR unchanged     Progression   Progression -- Continue to progress workloads to maintain intensity without signs/symptoms of physical distress. -- Continue to progress workloads to maintain intensity without signs/symptoms of physical distress. Continue to progress workloads to maintain intensity without signs/symptoms of physical distress.     Resistance Training   Training Prescription -- Yes -- Yes Yes   Weight -- 3 -- 4 4 lbs   Reps -- 10-15 -- 10-15 10-15     Treadmill   MPH -- 2.7 -- 2.8 2.9   Grade -- 0 -- 1 2   Minutes -- 15 -- 15 15   METs -- 3.07 -- 3.53 4.02     REL-XR   Level -- 3 -- 3 3   Speed -- 74 -- 64 70   Minutes -- 15 -- 15 15   METs -- 5.9 -- 5.9 6.3     Home  Exercise Plan   Plans to continue exercise at -- -- [employer] (comment)  [employer] (home: TM and rower) [employer] (comment) [employer] (comment)   Frequency -- -- Add 2 additional days to program exercise sessions. Add 2 additional days to program exercise sessions. Add 2 additional days to program exercise sessions.   Initial Home Exercises Provided -- -- 11/27/22 -- --     Oxygen   Maintain Oxygen Saturation -- 88% or higher -- 88% or higher 88% or higher    Row Name 01/13/23 1300             Response to Exercise   Blood Pressure (Admit) 108/60       Blood Pressure (Exit) 102/60       Heart Rate (Admit) 88 bpm       Heart Rate (Exercise) 118 bpm       Heart Rate (Exit) 72 bpm       Rating of Perceived Exertion (Exercise) 12       Duration Continue with 30 min of aerobic exercise without signs/symptoms of physical distress.       Intensity THRR unchanged         Progression  Progression Continue to progress workloads to maintain intensity without signs/symptoms of physical distress.         Resistance Training   Training Prescription Yes       Weight 4 lbs       Reps 10-15         Treadmill   MPH 3       Grade 3       Minutes 15       METs 4.54         REL-XR   Level 4       Speed 63       Minutes 15       METs 6.6         Home Exercise Plan   Plans to continue exercise at [facility] (comment)       Frequency Add 2 additional days to program exercise sessions.         Oxygen   Maintain Oxygen Saturation 88% or higher                Exercise Comments:   Exercise Goals and Review:   Exercise Goals     Row Name 11/17/22 0932             Exercise Goals   Increase Physical Activity Yes       Intervention Provide advice, education, support and counseling about physical activity/exercise needs.;Develop an individualized exercise prescription for aerobic and resistive training based on initial evaluation  findings, risk stratification, comorbidities and participant's personal goals.       Expected Outcomes Short Term: Attend rehab on a regular basis to increase amount of physical activity.;Long Term: Add in home exercise to make exercise part of routine and to increase amount of physical activity.;Long Term: Exercising regularly at least 3-5 days a week.       Increase Strength and Stamina Yes       Intervention Provide advice, education, support and counseling about physical activity/exercise needs.;Develop an individualized exercise prescription for aerobic and resistive training based on initial evaluation findings, risk stratification, comorbidities and participant's personal goals.       Expected Outcomes Short Term: Increase workloads from initial exercise prescription for resistance, speed, and METs.;Short Term: Perform resistance training exercises routinely during rehab and add in resistance training at home;Long Term: Improve cardiorespiratory fitness, muscular endurance and strength as measured by increased METs and functional capacity ( )       Able to understand and use rate of perceived exertion (RPE) scale Yes       Intervention Provide education and explanation on how to use RPE scale       Expected Outcomes Short Term: Able to use RPE daily in rehab to express subjective intensity level;Long Term:  Able to use RPE to guide intensity level when exercising independently       Able to understand and use Dyspnea scale Yes       Intervention Provide education and explanation on how to use Dyspnea scale       Expected Outcomes Short Term: Able to use Dyspnea scale daily in rehab to express subjective sense of shortness of breath during exertion;Long Term: Able to use Dyspnea scale to guide intensity level when exercising independently       Knowledge and understanding of Target Heart Rate Range (THRR) Yes       Intervention Provide education and explanation of THRR including how the numbers  were predicted and where they are located  for reference       Expected Outcomes Short Term: Able to state/look up THRR;Long Term: Able to use THRR to govern intensity when exercising independently;Short Term: Able to use daily as guideline for intensity in rehab       Able to check pulse independently Yes       Intervention Provide education and demonstration on how to check pulse in carotid and radial arteries.;Review the importance of being able to check your own pulse for safety during independent exercise       Expected Outcomes Short Term: Able to explain why pulse checking is important during independent exercise;Long Term: Able to check pulse independently and accurately       Understanding of Exercise Prescription Yes       Intervention Provide education, explanation, and written materials on patient's individual exercise prescription       Expected Outcomes Short Term: Able to explain program exercise prescription;Long Term: Able to explain home exercise prescription to exercise independently                Exercise Goals Re-Evaluation :  Exercise Goals Re-Evaluation     Row Name 11/17/22 0933 11/19/22 0917 11/27/22 1120 12/02/22 1117 12/28/22 1104     Exercise Goal Re-Evaluation   Exercise Goals Review Able to understand and use rate of perceived exertion (RPE) scale;Able to understand and use Dyspnea scale Increase Physical Activity;Understanding of Exercise Prescription;Increase Strength and Stamina Increase Physical Activity;Increase Strength and Stamina;Able to understand and use rate of perceived exertion (RPE) scale;Able to understand and use Dyspnea scale;Knowledge and understanding of Target Heart Rate Range (THRR);Able to check pulse independently;Understanding of Exercise Prescription Increase Physical Activity;Increase Strength and Stamina;Understanding of Exercise Prescription (P)  Increase Physical Activity;Increase Strength and Stamina;Understanding of Exercise  Prescription   Comments Reviewed RPE  and dyspnea scale, and program prescription with pt today.  Pt voiced understanding and was given a copy of goals to take home. [name] had her first session of cardiac rehab. She did great with her first ecercise and was working at an [employer] of 12 for both sets. Will continue to monitor and progress as able [name] is doing well in rehab.  She is feeling good and steadily increasing her workloads.  She is eager to get back to gym as well.  Reviewed home exercise with pt today.  Pt plans to walk and use treadmill and rower at home for exercise.  She also belongs to [employer] and wants to go back.  Reviewed THR, pulse, RPE, sign and symptoms, pulse oximetery and when to call 911 or MD.  Also discussed weather considerations and indoor options.  Pt voiced understanding. [name] (P)  [name] is doing great in rehab. She still keeps improving her endurance and stamin. She is ready to get back to the gym and will go 5 days a week. She stated that she does a lot more at the gym and is ready to get back after rehab. She is increaing her workloads in rehab everyother week.   Expected Outcomes Short: Use RPE daily to regulate intensity.  Long: Follow program prescription Short term: continue to working at her workload until RPE is 11 then increase worklaod   long term goal: continue to attend caridac rehab Short: Start to go back to gym Long: Continue to improve strength -- Short: Start to go back to gym Long: Continue to improve strength    Row Name 12/29/22 [phone]  Exercise Goal Re-Evaluation   Exercise Goals Review Increase Physical Activity;Increase Strength and Stamina;Understanding of Exercise Prescription       Comments [name] is tolerating exercise very well. She was going to the gym before rehab and is ready to get back. She is increasing her worklaods each week. She is at level 3.0 on the XR and a 2.9 speed with 2.0 grade on the treadmill. Will continue to montior and  progress as able.       Expected Outcomes Short term: continue to exerise at currently levels   long term: continue to attend rehab                 Discharge Exercise Prescription (Final Exercise Prescription Changes):  Exercise Prescription Changes - 01/13/23 1300       Response to Exercise   Blood Pressure (Admit) 108/60    Blood Pressure (Exit) 102/60    Heart Rate (Admit) 88 bpm    Heart Rate (Exercise) 118 bpm    Heart Rate (Exit) 72 bpm    Rating of Perceived Exertion (Exercise) 12    Duration Continue with 30 min of aerobic exercise without signs/symptoms of physical distress.    Intensity THRR unchanged      Progression   Progression Continue to progress workloads to maintain intensity without signs/symptoms of physical distress.      Resistance Training   Training Prescription Yes    Weight 4 lbs    Reps 10-15      Treadmill   MPH 3    Grade 3    Minutes 15    METs 4.54      REL-XR   Level 4    Speed 63    Minutes 15    METs 6.6      Home Exercise Plan   Plans to continue exercise at [facility] (comment)    Frequency Add 2 additional days to program exercise sessions.      Oxygen   Maintain Oxygen Saturation 88% or higher             Nutrition:  Target Goals: Understanding of nutrition guidelines, daily intake of sodium 1500mg , cholesterol 200mg , calories 30% from fat and 7% or less from saturated fats, daily to have 5 or more servings of fruits and vegetables.  Biometrics:  Pre Biometrics - 11/17/22 0933       Pre Biometrics   Height 5' 6.5" (1.689 m)    Weight 72.2 kg    Waist Circumference 34.25 inches    Hip Circumference 38 inches    Waist to Hip Ratio 0.9 %    BMI (Calculated) 25.31    Grip Strength 30 kg    Single Leg Stand 30 seconds             Post Biometrics - 01/08/23 1417        Post  Biometrics   Height 5' 6.5" (1.689 m)    Weight 72 kg    Waist Circumference 34 inches    Hip Circumference 38 inches     Waist to Hip Ratio 0.89 %    BMI (Calculated) 25.23    Grip Strength 27.9 kg    Single Leg Stand 30 seconds             Nutrition Therapy Plan and Nutrition Goals:  Nutrition Therapy & Goals - 11/17/22 0941       Intervention Plan   Intervention Prescribe, educate and counsel regarding individualized specific dietary modifications  aiming towards targeted core components such as weight, hypertension, lipid management, diabetes, heart failure and other comorbidities.;Nutrition handout(s) given to patient.    Expected Outcomes Short Term Goal: Understand basic principles of dietary content, such as calories, fat, sodium, cholesterol and nutrients.;Long Term Goal: Adherence to prescribed nutrition plan.             Nutrition Assessments:  MEDIFICTS Score Key: >=70 Need to make dietary changes  40-70 Heart Healthy Diet <= 40 Therapeutic Level Cholesterol Diet  Flowsheet Row CARDIAC REHAB PHASE II EXERCISE from 01/15/2023 in [facility] CARDIAC REHABILITATION  Picture Your Plate Total Score on Admission 68  Picture Your Plate Total Score on Discharge 71      Picture Your Plate Scores: <16 Unhealthy dietary pattern with much room for improvement. 41-50 Dietary pattern unlikely to meet recommendations for good health and room for improvement. 51-60 More healthful dietary pattern, with some room for improvement.  >60 Healthy dietary pattern, although there may be some specific behaviors that could be improved.    Nutrition Goals Re-Evaluation:  Nutrition Goals Re-Evaluation     Row Name 11/27/22 1123 12/28/22 1111           Goals   Nutrition Goal Heart Healhty Diet Heart Healhty Diet      Comment [name] is doing well in rehab.  She has been trying to eat better and making better choices.  She has found that she now reaches for a piece of fruit when she wants a snack. She is trying to adopt changes but is limited by what her mom will also eat.  She feels that she has a  good grasp on her diet and does limit her sodium intake as well. [place] is doing good in rehab. She continues to try to eat better and making better choice. She continues to go for healthier options now. She is still limited by what her mom can eat but feels she is doing good with picking healthjy options and limiting her sodium      Expected Outcome Short: continue to add in more fruit and vegetables Long: continue to focus on healthy eating Short: continue to add in more fruit and vegetables Long: continue to focus on healthy eating               Nutrition Goals Discharge (Final Nutrition Goals Re-Evaluation):  Nutrition Goals Re-Evaluation - 12/28/22 1111       Goals   Nutrition Goal Heart Healhty Diet    Comment [name] is doing good in rehab. She continues to try to eat better and making better choice. She continues to go for healthier options now. She is still limited by what her mom can eat but feels she is doing good with picking healthjy options and limiting her sodium    Expected Outcome Short: continue to add in more fruit and vegetables Long: continue to focus on healthy eating             Psychosocial: Target Goals: Acknowledge presence or absence of significant depression and/or stress, maximize coping skills, provide positive support system. Participant is able to verbalize types and ability to use techniques and skills needed for reducing stress and depression.  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  Using trazodone for sleep. Using smoking for anxiety.      Family Dynamics   Good Support System? Yes      Barriers   Psychosocial barriers to participate in program The patient should benefit from training in  stress management and relaxation.;Psychosocial barriers identified (see note)      Screening Interventions   Interventions Encouraged to exercise;To provide support and resources with identified psychosocial needs;Provide feedback about the scores to participant    Expected Outcomes Short Term goal: Utilizing psychosocial counselor, staff and physician to assist with identification of specific Stressors or current issues interfering with healing process. Setting desired goal for each stressor or current issue identified.;Long Term Goal: Stressors or current issues are controlled or eliminated.;Short Term goal: Identification and review with participant of any Quality of Life or Depression concerns found by scoring the questionnaire.;Long Term goal: The participant improves quality of Life and PHQ9 Scores as seen by post scores and/or verbalization of changes             Quality of Life Scores:  Quality of Life - 01/19/23 1237       Quality of Life   Select Quality of Life      Quality of Life Scores   Health/Function Pre 24.53 %    Health/Function Post 22.83 %    Health/Function % Change -6.93 %    Socioeconomic Pre 27.5 %    Socioeconomic Post 22.5 %    Socioeconomic % Change  -18.18 %    Psych/Spiritual Pre 22.5 %    Psych/Spiritual Post 22.5 %    Psych/Spiritual % Change 0 %    Family Pre 28.5 %    Family Post 22.5 %    Family % Change -21.05 %    GLOBAL Pre 25.14 %    GLOBAL Post 22.64 %    GLOBAL % Change -9.94 %            Scores of 19 and below usually indicate a poorer quality of life in these areas.  A difference of  2-3 points is a clinically meaningful difference.  A difference of 2-3 points in the total score of the Quality of Life Index has been associated with significant improvement in overall quality of life, self-image, physical symptoms, and general health in studies assessing change in quality of life.  PHQ-9: Review Flowsheet       01/19/2023  12/28/2022 11/17/2022  Depression screen PHQ 2/9  Decreased Interest 0 0 1  Down, Depressed, Hopeless 0 0 0  PHQ - 2 Score 0 0 1  Altered sleeping 1 1 0  Tired, decreased energy 1 0 1  Change in appetite 2 0 3  Feeling bad or failure about yourself  0 0 1  Trouble concentrating 0 1 0  Moving slowly or fidgety/restless 0 0 0  Suicidal thoughts 0 0 0  PHQ-9 Score 4 2 6   Difficult doing work/chores Not difficult at all Not difficult at all Somewhat difficult    Details           Interpretation of Total Score  Total Score Depression Severity:  1-4 = Minimal depression, 5-9 = Mild depression, 10-14 = Moderate depression, 15-19 = Moderately severe depression, 20-27 = Severe depression   Psychosocial Evaluation and Intervention:  Psychosocial Evaluation - 11/17/22 0934       Psychosocial Evaluation & Interventions   Interventions Stress management education;Relaxation education;Encouraged to exercise with the program and follow exercise prescription  Comments [name] is coming into cardiac rehab after a NSTEMI and stent.  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  Prior to caring for her mother full time she was active and going to gym regularly.  She would like to get back there again soon.  She is a chronic bad sleeper and uses trazadone at night to help her calm her mind and go to sleep.  She could benefit from stress education and relaxation techniques.  Her other biggest stress is trying to quit smoking.  She has been a 1/2 ppd for many years.  She is down to 4-5 cigarettes a day and wants to continue to wean. She has talked about it with her doctor and was encouraged to try the patches and gum to help quit.  We also reviewed the quit smoking packet today.  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   She enjoys that a lot.  She is looking forward to program and getting moving again and back to  gym.  She has no barriers to getting to rehab and is able to leave her mother to come to rehab.  She is on Medicaid and will have the 12 week limit.    Expected Outcomes Short: Attend rehab to get back to habit of exercise Long: Conitnue to work on quitting smoking             Psychosocial Re-Evaluation:  Psychosocial Re-Evaluation     Row Name 11/27/22 1127 12/28/22 1109           Psychosocial Re-Evaluation   Current issues with Current Stress Concerns Current Stress Concerns      Comments [name] is doing well in rehab.  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  She and her mom butt heads multiple times a week which is very frustrating for her.  She has been able to get help to come in most mornings to give her a break and to allow her to go out to do errands and get to rehab/exercise.  She says having the help has made a big difference for her.  She is also working on quitting smoking, which has always been a source of stress relief. She is meeitng with her doctor next week for meds/resources.  We commended her for taking this next step in her health.  She is still sleeping well. [name] is doing well in rehab. She said her stress with taking care of her mom has been better. Her mom has not been butting heads as much and frustrating her.  She continues to sleep well.      Expected Outcomes Short: Continue to work on quitting smoking Long: Continue to cope with caretaking and making time for self. Short: Continue to work on quitting smoking Long: Continue to cope with caretaking and making time for self.      Interventions Encouraged to attend Pulmonary Rehabilitation for the exercise Stress management education;Encouraged to attend Cardiac Rehabilitation for the exercise;Relaxation education      Continue Psychosocial Services  Follow up required by staff Follow up required by staff               Psychosocial Discharge (Final Psychosocial Re-Evaluation):  Psychosocial  Re-Evaluation - 12/28/22 1109       Psychosocial Re-Evaluation   Current issues with Current Stress Concerns    Comments [name] is  doing well in rehab. She said her stress with taking care of her mom has been better. Her mom has not been butting heads as much and frustrating her.  She continues to sleep well.    Expected Outcomes Short: Continue to work on quitting smoking Long: Continue to cope with caretaking and making time for self.    Interventions Stress management education;Encouraged to attend Cardiac Rehabilitation for the exercise;Relaxation education    Continue Psychosocial Services  Follow up required by staff             Vocational Rehabilitation: Provide vocational rehab assistance to qualifying candidates.   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            Education: Education Goals: Education classes will be provided on a weekly basis, covering required topics. Participant will state understanding/return demonstration of topics presented.  Learning Barriers/Preferences:  Learning Barriers/Preferences - 11/17/22 0802       Learning Barriers/Preferences   Learning Barriers Sight   glasses   Learning Preferences Written Material             Education Topics: Hypertension, Hypertension Reduction -Define heart disease and high blood pressure. Discus how high blood pressure affects the body and ways to reduce high blood pressure.   Exercise and Your Heart -Discuss why it is important to exercise, the FITT principles of exercise, normal and abnormal responses to exercise, and how to exercise safely.   Angina -Discuss definition of angina, causes of angina, treatment of angina, and how to decrease risk of having angina. Flowsheet Row CARDIAC REHAB PHASE II EXERCISE  from 01/15/2023 in [place] CARDIAC REHABILITATION  Date 12/23/22  Educator [facility]  Instruction Review Code 1- Verbalizes Understanding       Cardiac Medications -Review what the following cardiac medications are used for, how they affect the body, and side effects that may occur when taking the medications.  Medications include Aspirin, Beta blockers, calcium channel blockers, ACE Inhibitors, angiotensin receptor blockers, diuretics, digoxin, and antihyperlipidemics. Flowsheet Row CARDIAC REHAB PHASE II EXERCISE from 01/15/2023 in [place] CARDIAC REHABILITATION  Date 12/02/22  Educator DJ       Congestive Heart Failure -Discuss the definition of CHF, how to live with CHF, the signs and symptoms of CHF, and how keep track of weight and sodium intake. Flowsheet Row CARDIAC REHAB PHASE II EXERCISE from 01/15/2023 in [place] CARDIAC REHABILITATION  Date 12/16/22  Educator [facility]  Instruction Review Code 2- Demonstrated Understanding       Heart Disease and Intimacy -Discus the effect sexual activity has on the heart, how changes occur during intimacy as we age, and safety during sexual activity. Flowsheet Row CARDIAC REHAB PHASE II EXERCISE from 01/15/2023 in [place] CARDIAC REHABILITATION  Date 01/06/23  Educator HB  Instruction Review Code 1- Verbalizes Understanding       Smoking Cessation / COPD -Discuss different methods to quit smoking, the health benefits of quitting smoking, and the definition of COPD. Flowsheet Row CARDIAC REHAB PHASE II EXERCISE from 01/15/2023 in [place] CARDIAC REHABILITATION  Date 12/09/22  Educator [facility]  Instruction Review Code 1- Verbalizes Understanding       Nutrition I: Fats -Discuss the types of cholesterol, what cholesterol does to the heart, and how cholesterol levels can be  controlled. Flowsheet Row CARDIAC REHAB PHASE II EXERCISE from 11/18/2022 in [place] CARDIAC REHABILITATION  Date 11/18/22  Educator [facility]  Instruction  Review Code 1- Verbalizes Understanding       Nutrition II: Labels -Discuss the different components of food labels and how to read food label Flowsheet Row CARDIAC REHAB PHASE II EXERCISE from 11/18/2022 in [place] CARDIAC REHABILITATION  Date 11/18/22  Educator [facility]  Instruction Review Code 1- Verbalizes Understanding       Heart Parts/Heart Disease and PAD -Discuss the anatomy of the heart, the pathway of blood circulation through the heart, and these are affected by heart disease.   Stress I: Signs and Symptoms -Discuss the causes of stress, how stress may lead to anxiety and depression, and ways to limit stress. Flowsheet Row CARDIAC REHAB PHASE II EXERCISE from 01/15/2023 in [place] CARDIAC REHABILITATION  Date 11/25/22  Educator [facility]  Instruction Review Code 2- Demonstrated Understanding       Stress II: Relaxation -Discuss different types of relaxation techniques to limit stress. Flowsheet Row CARDIAC REHAB PHASE II EXERCISE from 01/15/2023 in [place] CARDIAC REHABILITATION  Date 11/25/22  Educator [facility]  Instruction Review Code 2- Demonstrated Understanding       Warning Signs of Stroke / TIA -Discuss definition of a stroke, what the signs and symptoms are of a stroke, and how to identify when someone is having stroke.   Knowledge Questionnaire Score:  Knowledge Questionnaire Score - 01/19/23 1237       Knowledge Questionnaire Score   Pre Score 23/28    Post Score 23/28             Core Components/Risk Factors/Patient Goals at Admission:  Personal Goals and Risk Factors at Admission - 11/17/22 0942       Core Components/Risk Factors/Patient Goals on Admission    Weight Management Yes;Weight Loss    Intervention Weight Management: Develop a combined nutrition and exercise program designed to reach desired caloric intake, while maintaining appropriate intake of nutrient and fiber, sodium and fats, and appropriate energy expenditure required for  the weight goal.;Weight Management: Provide education and appropriate resources to help participant work on and attain dietary goals.    Admit Weight 159 lb 3.2 oz (72.2 kg)    Goal Weight: Short Term 155 lb (70.3 kg)    Goal Weight: Long Term 155 lb (70.3 kg)    Expected Outcomes Short Term: Continue to assess and modify interventions until short term weight is achieved;Long Term: Adherence to nutrition and physical activity/exercise program aimed toward attainment of established weight goal;Weight Loss: Understanding of general recommendations for a balanced deficit meal plan, which promotes 1-2 lb weight loss per week and includes a negative energy balance of [phone] kcal/d;Understanding recommendations for meals to include 15-35% energy as protein, 25-35% energy from fat, 35-60% energy from carbohydrates, less than 200mg  of dietary cholesterol, 20-35 gm of total fiber daily;Understanding of distribution of calorie intake throughout the day with the consumption of 4-5 meals/snacks;Weight Maintenance: Understanding of the daily nutrition guidelines, which includes 25-35% calories from fat, 7% or less cal from saturated fats, less than 200mg  cholesterol, less than 1.5gm of sodium, & 5 or more servings of fruits and vegetables daily    Tobacco Cessation Yes    Number of packs per day 1/2 pack per day    Intervention Assist the participant in steps to quit. Provide individualized education and counseling about committing to Tobacco Cessation, relapse prevention, and pharmacological support that can  be provided by physician.;Education officer, environmental, assist with locating and accessing local/national Quit Smoking programs, and support quit date choice.    Expected Outcomes Short Term: Will demonstrate readiness to quit, by selecting a quit date.;Short Term: Will quit all tobacco product use, adhering to prevention of relapse plan.;Long Term: Complete abstinence from all tobacco products for at least 12  months from quit date.    Diabetes Yes    Intervention Provide education about signs/symptoms and action to take for hypo/hyperglycemia.;Provide education about proper nutrition, including hydration, and aerobic/resistive exercise prescription along with prescribed medications to achieve blood glucose in normal ranges: Fasting glucose 65-99 mg/dL    Expected Outcomes Short Term: Participant verbalizes understanding of the signs/symptoms and immediate care of hyper/hypoglycemia, proper foot care and importance of medication, aerobic/resistive exercise and nutrition plan for blood glucose control.;Long Term: Attainment of HbA1C < 7%.    Hypertension Yes    Intervention Provide education on lifestyle modifcations including regular physical activity/exercise, weight management, moderate sodium restriction and increased consumption of fresh fruit, vegetables, and low fat dairy, alcohol moderation, and smoking cessation.;Monitor prescription use compliance.    Expected Outcomes Short Term: Continued assessment and intervention until BP is < 140/27mm HG in hypertensive participants. < 130/30mm HG in hypertensive participants with diabetes, heart failure or chronic kidney disease.;Long Term: Maintenance of blood pressure at goal levels.    Lipids Yes    Intervention Provide education and support for participant on nutrition & aerobic/resistive exercise along with prescribed medications to achieve LDL 70mg , HDL >40mg .    Expected Outcomes Short Term: Participant states understanding of desired cholesterol values and is compliant with medications prescribed. Participant is following exercise prescription and nutrition guidelines.;Long Term: Cholesterol controlled with medications as prescribed, with individualized exercise RX and with personalized nutrition plan. Value goals: LDL < 70mg , HDL > 40 mg.             Core Components/Risk Factors/Patient Goals Review:   Goals and Risk Factor Review     Row Name  11/17/22 0943 11/27/22 1115 12/28/22 1113         Core Components/Risk Factors/Patient Goals Review   Personal Goals Review Tobacco Cessation Weight Management/Obesity;Tobacco Cessation;Hypertension --     Review [name] is a current tobacco user. Intervention for tobacco cessation was provided at the initial medical review. She was asked about readiness to quit and reported she is trying to reduce her smoking. Patient was advised and educated about tobacco cessation using combination therapy, tobacco cessation classes, quit line, and quit smoking apps. Patient demonstrated understanding of this material. Staff will continue to provide encouragement and follow up with the patient throughout the program. [name] is doing well in rehab. Her weight is steady and her pressures have been good.  Her biggest goal is to work on quitting smoking.  She has made up her mind to quit.  She is meeting with her PCP next week to talk about adding in Wellbutrin or Chantix to help.  She has tried patches before but was not successful. We talked about using all her options in unison to get an improved benefit.  She has not tried lozenges or gum before so we talked through those options as well. [name] has been taking Chantix to help with smoking and has not had anything for 6 days. She goes back to her PCP for a check in with quiting and her medication is running out to see what the next step for quititng is. She is hoping to  refill the medication to continue to help with quiting smoking. She continues to have a steady weight and her blood pressure continues to be good.     Expected Outcomes Short: Try patches with gum/lonzenge Long: Set potential quit date Shrot: Meet with MD about quitting smoking Long: Set quit date and keep monitoring risk factors. Short: coninue to focus on quitting smoking   long term: Continue to montior weight and BP and exercise              Core Components/Risk Factors/Patient Goals at Discharge  (Final Review):   Goals and Risk Factor Review - 12/28/22 1113       Core Components/Risk Factors/Patient Goals Review   Review [name] has been taking Chantix to help with smoking and has not had anything for 6 days. She goes back to her PCP for a check in with quiting and her medication is running out to see what the next step for quititng is. She is hoping to refill the medication to continue to help with quiting smoking. She continues to have a steady weight and her blood pressure continues to be good.    Expected Outcomes Short: coninue to focus on quitting smoking   long term: Continue to montior weight and BP and exercise             ITP Comments:  ITP Comments     Row Name 11/16/22 1112 11/17/22 0927 11/18/22 0831 12/16/22 0824 01/13/23 0846   ITP Comments Virtual orientation completed.  Diagnosis documentation found in [facility] 11-05-22 .  Orientation schedule 11-17-22 8am. Patient attend orientation today.  Patient is attendingCardiac Rehabilitation Program.  Documentation for diagnosis can be found in [facility] 11/03/22.  Reviewed medical chart, RPE/RPD, gym safety, and program guidelines.  Patient was fitted to equipment they will be using during rehab.  Patient is scheduled to start exercise on 11/18/22 at 1100.   Initial ITP created and sent for review and signature by Dr. Dina Rich, Medical Director for Cardiac Rehabilitation Program. 30 day review completed. ITP sent to Dr. Dina Rich, Medical Director of Cardiac Rehab. Continue with ITP unless changes are made by physician.  Only attended orinetaion thus far 30 day review completed. ITP sent to Dr. Dina Rich, Medical Director of Cardiac Rehab. Continue with ITP unless changes are made by physician. 30 day review completed. ITP sent to Dr. Dina Rich, Medical Director of Cardiac Rehab. Continue with ITP unless changes are made by physician.    Row Name 01/15/23 1116           ITP Comments [name] graduated today from  rehab  with 33 sessions completed.  Details of the patient's exercise prescription and what She needs to do in order to continue the prescription and progress were discussed with patient.  Patient was given a copy of prescription and goals.  Patient verbalized understanding. [name] plans to continue to exercise by going to the community center to exercise .                Comments: Discharge ITP

## 2023-01-20 ENCOUNTER — Encounter (HOSPITAL_COMMUNITY): Payer: Medicaid Other

## 2023-01-22 ENCOUNTER — Encounter (HOSPITAL_COMMUNITY): Payer: Medicaid Other

## 2023-01-25 ENCOUNTER — Encounter (HOSPITAL_COMMUNITY): Payer: Medicaid Other

## 2023-01-27 ENCOUNTER — Encounter (HOSPITAL_COMMUNITY): Payer: Medicaid Other

## 2023-01-28 ENCOUNTER — Other Ambulatory Visit (HOSPITAL_COMMUNITY): Payer: Self-pay

## 2023-01-29 ENCOUNTER — Encounter (HOSPITAL_COMMUNITY): Payer: Medicaid Other

## 2023-02-01 ENCOUNTER — Other Ambulatory Visit (HOSPITAL_COMMUNITY): Payer: Self-pay | Admitting: *Deleted

## 2023-02-01 ENCOUNTER — Encounter (HOSPITAL_COMMUNITY): Payer: Medicaid Other

## 2023-02-01 DIAGNOSIS — Z1231 Encounter for screening mammogram for malignant neoplasm of breast: Secondary | ICD-10-CM

## 2023-02-03 ENCOUNTER — Encounter (HOSPITAL_COMMUNITY): Payer: Medicaid Other

## 2023-02-05 ENCOUNTER — Encounter (HOSPITAL_COMMUNITY): Payer: Medicaid Other

## 2023-02-08 ENCOUNTER — Encounter (HOSPITAL_COMMUNITY): Payer: Medicaid Other

## 2023-02-10 ENCOUNTER — Encounter (HOSPITAL_COMMUNITY): Payer: Medicaid Other

## 2023-02-12 ENCOUNTER — Encounter (HOSPITAL_COMMUNITY): Payer: Medicaid Other

## 2023-02-25 ENCOUNTER — Ambulatory Visit (HOSPITAL_COMMUNITY)
Admission: RE | Admit: 2023-02-25 | Discharge: 2023-02-25 | Disposition: A | Discharge status: Home / Self Care | Payer: Medicaid Other | Source: Ambulatory Visit | Attending: *Deleted | Admitting: *Deleted

## 2023-02-25 DIAGNOSIS — Z1231 Encounter for screening mammogram for malignant neoplasm of breast: Secondary | ICD-10-CM | POA: Diagnosis present

## 2023-03-08 ENCOUNTER — Encounter: Payer: Self-pay | Admitting: Cardiology

## 2023-03-08 ENCOUNTER — Ambulatory Visit: Payer: Medicaid Other | Attending: Cardiology | Admitting: Cardiology

## 2023-03-08 VITALS — BP 126/70 | HR 60 | Ht 67.0 in | Wt 164.0 lb

## 2023-03-08 DIAGNOSIS — E782 Mixed hyperlipidemia: Secondary | ICD-10-CM

## 2023-03-08 DIAGNOSIS — I2510 Atherosclerotic heart disease of native coronary artery without angina pectoris: Secondary | ICD-10-CM

## 2023-03-08 DIAGNOSIS — I10 Essential (primary) hypertension: Secondary | ICD-10-CM | POA: Diagnosis not present

## 2023-03-08 NOTE — Progress Notes (Signed)
Clinical Summary Ms. Balka is a 62 y.o.female  1.CAD - admit 10/2022 with chest pain, very atypical symptoms. Right upper chest down right arm, lasting hours at a time with a positional component, better with prn tylenol and ibuprofen   - trop is elevated, 274 without clear peak. EKG lateral ST depressions without prior EKG for comparison - echo LVEF 55-60%, no WMAs 10/2022 cath: LAD 20%, LCX mid 30%, RCA distal 99%. RCA received DES  - SOB on brillinta, changed to plavix during admission  - no chest pains, no SOB/DOE - compliant with meds   2. HTN - compliant with meds    3. HLD 02/2023 TC 141 TG 108 HDL 60 LDL 62  Past Medical History:  Diagnosis Date   Diabetes mellitus without complication (HCC)    Hypertension      Allergies  Allergen Reactions   Prednisone Rash   Lisinopril Swelling     Current Outpatient Medications  Medication Sig Dispense Refill   aspirin EC 81 MG tablet Take 1 tablet (81 mg total) by mouth daily. Swallow whole. 120 tablet 0   atorvastatin (LIPITOR) 40 MG tablet Take 1 tablet (40 mg total) by mouth every evening. 90 tablet 0   Calcium Carbonate Antacid (ANTACID PO) Take 1 tablet by mouth daily.     cetirizine (ZYRTEC) 10 MG tablet Take 10 mg by mouth daily.     clopidogrel (PLAVIX) 75 MG tablet Take 1 tablet (75 mg total) by mouth daily. 90 tablet 0   desonide (DESOWEN) 0.05 % cream Use twice daily for flare ups on face, maximum 10 days. 60 g 5   diphenhydrAMINE (BENADRYL ALLERGY) 25 MG tablet Take 50 mg by mouth 2 (two) times daily. PRN     EPINEPHrine 0.3 mg/0.3 mL IJ SOAJ injection Inject into the muscle.     hydrochlorothiazide (HYDRODIURIL) 12.5 MG tablet Take 12.5 mg by mouth daily.     hydrOXYzine (ATARAX) 25 MG tablet Take 25 mg by mouth 4 (four) times daily as needed.     KRILL OIL PO Take 2 tablets by mouth 2 (two) times daily.     metFORMIN (GLUCOPHAGE) 1000 MG tablet Take 1,000 mg by mouth 2 (two) times daily.      metoprolol succinate (TOPROL-XL) 25 MG 24 hr tablet Take 0.5 tablets (12.5 mg total) by mouth daily. 60 tablet 0   Multiple Vitamin (MULTIVITAMIN ADULT PO) Take 1 tablet by mouth.     nitroGLYCERIN (NITROSTAT) 0.4 MG SL tablet Place 1 tablet (0.4 mg total) under the tongue every 5 (five) minutes x 3 doses as needed for chest pain. 25 tablet 0   psyllium (REGULOID) 0.52 g capsule Take 0.52 g by mouth daily.     traZODone (DESYREL) 100 MG tablet Take 50-100 mg by mouth at bedtime as needed.     triamcinolone ointment (KENALOG) 0.1 % Apply twice daily for flare ups below neck, maximum 10 days. 453.6 g 1   No current facility-administered medications for this visit.     Past Surgical History:  Procedure Laterality Date   ANKLE FRACTURE SURGERY Right    CORONARY STENT INTERVENTION N/A 11/04/2022   Procedure: CORONARY STENT INTERVENTION;  Surgeon: Kathleene Hazel, MD;  Location: [facility] INVASIVE CV LAB;  Service: Cardiovascular;  Laterality: N/A;   LEFT HEART CATH AND CORONARY ANGIOGRAPHY N/A 11/04/2022   Procedure: LEFT HEART CATH AND CORONARY ANGIOGRAPHY;  Surgeon: Kathleene Hazel, MD;  Location: [facility] INVASIVE CV LAB;  Service: Cardiovascular;  Laterality: N/A;     Allergies  Allergen Reactions   Prednisone Rash   Lisinopril Swelling      Family History  Problem Relation Age of Onset   Diabetes Father      Social History Ms. Babler reports that she has been smoking cigarettes. She has never been exposed to tobacco smoke. She has never used smokeless tobacco. Ms. Dohrman reports that she does not currently use alcohol.   Review of Systems CONSTITUTIONAL: No weight loss, fever, chills, weakness or fatigue.  HEENT: Eyes: No visual loss, blurred vision, double vision or yellow sclerae.No hearing loss, sneezing, congestion, runny nose or sore throat.  SKIN: No rash or itching.  CARDIOVASCULAR: per hpi RESPIRATORY: No shortness of breath, cough or sputum.  GASTROINTESTINAL: No  anorexia, nausea, vomiting or diarrhea. No abdominal pain or blood.  GENITOURINARY: No burning on urination, no polyuria NEUROLOGICAL: No headache, dizziness, syncope, paralysis, ataxia, numbness or tingling in the extremities. No change in bowel or bladder control.  MUSCULOSKELETAL: No muscle, back pain, joint pain or stiffness.  LYMPHATICS: No enlarged nodes. No history of splenectomy.  PSYCHIATRIC: No history of depression or anxiety.  ENDOCRINOLOGIC: No reports of sweating, cold or heat intolerance. No polyuria or polydipsia.  Marland Kitchen   Physical Examination Today's Vitals   03/08/23 1521  BP: 126/70  Pulse: 60  SpO2: 98%  Weight: 164 lb (74.4 kg)  Height: 5\' 7"  (1.702 m)   Body mass index is 25.69 kg/m.  Gen: resting comfortably, no acute distress HEENT: no scleral icterus, pupils equal round and reactive, no palptable cervical adenopathy,  CV: RRR, no m/rg, no jvd Resp: Clear to auscultation bilaterally GI: abdomen is soft, non-tender, non-distended, normal bowel sounds, no hepatosplenomegaly MSK: extremities are warm, no edema.  Skin: warm, no rash Neuro:  no focal deficits Psych: appropriate affect      Assessment and Plan  1.CAD - recent NSTEMI, received DES to RCA - no recent symptoms, continue current meds. Plavix until 10/2023  2. HLD - at goal, continue current meds  3.HTN -at goal, continue current meds    Antoine Poche, M.D.

## 2023-03-08 NOTE — Patient Instructions (Addendum)

## 2023-03-10 ENCOUNTER — Other Ambulatory Visit (HOSPITAL_COMMUNITY): Payer: Self-pay

## 2023-04-12 ENCOUNTER — Ambulatory Visit: Payer: Medicaid Other | Admitting: Internal Medicine

## 2023-05-11 ENCOUNTER — Ambulatory Visit: Payer: PRIVATE HEALTH INSURANCE | Admitting: Dermatology

## 2023-06-24 NOTE — Patient Instructions (Signed)
 Allergic rhinitis Continue allergen avoidance measures directed toward outdoor mold, dust mite, and cat as listed below Continue cetirizine 10 mg once a day if needed for runny nose or itch Try Ryaltris 2 sprays in each nostril twice a day if needed for nasal symptoms Consider saline nasal rinses as needed for nasal symptoms. Use this before any medicated nasal sprays for best result Consider allergen immunotherapy if your symptoms are not well-controlled with the treatment plan as listed above  Allergic conjunctivitis Begin cromolyn eye drops 1-2 drops in each eye up to 4 times a day if needed for red or itchy eyes  Allergic contact dermatitis For red or itchy areas, continue desonide 0.05% up to twice a day if needed.  Do not use this medication for longer than 2 weeks in a row. Continue to take pictures If your symptoms re-occur, begin a journal of events that occurred for up to 6 hours before your symptoms began including foods and beverages consumed, soaps or perfumes you had contact with, and medications.   Tobacco use It is best to cut down on tobacco use and better to quit.  Call the clinic if this treatment plan is not working well for you.  Follow up in 1 year or sooner if needed.  Control of Mold Allergen Mold and fungi can grow on a variety of surfaces provided certain temperature and moisture conditions exist.  Outdoor molds grow on plants, decaying vegetation and soil.  The major outdoor mold, Alternaria and Cladosporium, are found in very high numbers during hot and dry conditions.  Generally, a late Summer - Fall peak is seen for common outdoor fungal spores.  Rain will temporarily lower outdoor mold spore count, but counts rise rapidly when the rainy period ends.  The most important indoor molds are Aspergillus and Penicillium.  Dark, humid and poorly ventilated basements are ideal sites for mold growth.  The next most common sites of mold growth are the bathroom and the  kitchen.  Outdoor [employer] Use air conditioning and keep windows closed Avoid exposure to decaying vegetation. Avoid leaf raking. Avoid grain handling. Consider wearing a face mask if working in moldy areas.  Indoor Mold Control Maintain humidity below 50%. Clean washable surfaces with 5% bleach solution. Remove sources e.g. Contaminated carpets.   Control of Dust Mite Allergen Dust mites play a major role in allergic asthma and rhinitis. They occur in environments with high humidity wherever human skin is found. Dust mites absorb humidity from the atmosphere (ie, they do not drink) and feed on organic matter (including shed human and animal skin). Dust mites are a microscopic type of insect that you cannot see with the naked eye. High levels of dust mites have been detected from mattresses, pillows, carpets, upholstered furniture, bed covers, clothes, soft toys and any woven material. The principal allergen of the dust mite is found in its feces. A gram of dust may contain 1,000 mites and 250,000 fecal particles. Mite antigen is easily measured in the air during house cleaning activities. Dust mites do not bite and do not cause harm to humans, other than by triggering allergies/asthma.  Ways to decrease your exposure to dust mites in your home:  1. Encase mattresses, box springs and pillows with a mite-impermeable barrier or cover  2. Wash sheets, blankets and drapes weekly in hot water (130 F) with detergent and dry them in a dryer on the hot setting.  3. Have the room cleaned frequently with a vacuum cleaner  and a damp dust-mop. For carpeting or rugs, vacuuming with a vacuum cleaner equipped with a high-efficiency particulate air (HEPA) filter. The dust mite allergic individual should not be in a room which is being cleaned and should wait 1 hour after cleaning before going into the room.  4. Do not sleep on upholstered furniture (eg, couches).  5. If possible removing carpeting,  upholstered furniture and drapery from the home is ideal. Horizontal blinds should be eliminated in the rooms where the person spends the most time (bedroom, study, television room). Washable vinyl, roller-type shades are optimal.  6. Remove all non-washable stuffed toys from the bedroom. Wash stuffed toys weekly like sheets and blankets above.  7. Reduce indoor humidity to less than 50%. Inexpensive humidity monitors can be purchased at most hardware stores. Do not use a humidifier as can make the problem worse and are not recommended.  Control of Dog or Cat Allergen Avoidance is the best way to manage a dog or cat allergy. If you have a dog or cat and are allergic to dog or cats, consider removing the dog or cat from the home. If you have a dog or cat but don't want to find it a new home, or if your family wants a pet even though someone in the household is allergic, here are some strategies that may help keep symptoms at bay:  Keep the pet out of your bedroom and restrict it to only a few rooms. Be advised that keeping the dog or cat in only one room will not limit the allergens to that room. Don't pet, hug or kiss the dog or cat; if you do, wash your hands with soap and water. High-efficiency particulate air (HEPA) cleaners run continuously in a bedroom or living room can reduce allergen levels over time. Regular use of a high-efficiency vacuum cleaner or a central vacuum can reduce allergen levels. Giving your dog or cat a bath at least once a week can reduce airborne allergen.

## 2023-06-24 NOTE — Progress Notes (Signed)
 [address] [place] [address] Dept: [phone]  FOLLOW UP NOTE  Patient ID: [name], female    DOB: [birth year]  Age: 63 y.o. MRN: [id number] Date of Office Visit: 06/25/2023  Assessment  Chief Complaint: Allergic Rhinitis  (Itch watery eyes - none of her medications are working )  HPI [name] is a 63 year old female who presents to the clinic for follow-up visit.  She was last seen in this clinic on 10/05/2022 by Dr. Dellis Anes for evaluation of allergic rhinitis and allergic contact dermatitis.    At today's visit, she reports her allergic rhinitis has been moderately well-controlled with occasional nasal symptoms for which she continues cetirizine daily and uses nasal rinses as needed.  Her last environmental allergy skin testing was on 10/05/2022 and was positive to outdoor mold, dust mite, and cat.  She does note that 4 cats live in her home, however, they do not sleep in her room.  There are no dust mite covers and use on her mattress or pillow at this time.  Allergic conjunctivitis is reported as poorly controlled with symptoms including red and itchy eyes as well as clear watery drainage daily.  She has tried over-the-counter allergy preparations with only mild relief of symptoms.   She denies any hives or itching for over 1 year.  She continues to smoke 2 to 3 cigarettes a day and is currently trying to cut down and quit smoking cigarettes.  Her current medications are listed in the chart.  Drug Allergies:  Allergies  Allergen Reactions   Ticagrelor Shortness Of Breath   Prednisone Rash   Lisinopril Swelling    Physical Exam: BP 120/68 (BP Location: Right Arm, Patient Position: Sitting, Cuff Size: Normal)   Pulse 72   Temp 97.9 F (36.6 C) (Temporal)   Resp 18   Ht 5\' 7"  (1.702 m)   Wt 161 lb (73 kg)   SpO2 99%   BMI 25.22 kg/m    Physical Exam Vitals reviewed.  Constitutional:      Appearance: Normal appearance.  HENT:      Head: Normocephalic and atraumatic.     Right Ear: Tympanic membrane normal.     Left Ear: Tympanic membrane normal.     Nose:     Comments: Bilateral naris slightly erythematous with thin clear nasal drainage noted.  Pharynx normal.  Ears normal.  Conjunctival normal.  Frequent clear drainage noted.    Mouth/Throat:     Pharynx: Oropharynx is clear.  Cardiovascular:     Rate and Rhythm: Normal rate and regular rhythm.     Heart sounds: Normal heart sounds. No murmur heard. Pulmonary:     Effort: Pulmonary effort is normal.     Breath sounds: Normal breath sounds.     Comments: Lungs clear to auscultation Musculoskeletal:        General: Normal range of motion.     Cervical back: Normal range of motion and neck supple.  Skin:    General: Skin is warm and dry.  Neurological:     Mental Status: She is alert and oriented to person, place, and time.  Psychiatric:        Mood and Affect: Mood normal.        Behavior: Behavior normal.        Thought Content: Thought content normal.        Judgment: Judgment normal.     Assessment and Plan: 1. Seasonal and perennial allergic rhinitis  2. Allergic contact dermatitis, unspecified trigger   3. Seasonal allergic conjunctivitis   4. Tobacco use     Meds ordered this encounter  Medications   cetirizine (ZYRTEC) 10 MG tablet    Sig: Take 1 tablet (10 mg total) by mouth daily as needed for allergies.    Dispense:  90 tablet    Refill:  3   cromolyn (OPTICROM) 4 % ophthalmic solution    Sig: Place 1-2 drops in each eye up to 4 times a day if needed for red or itchy eyes    Dispense:  10 mL    Refill:  11   DISCONTD: Olopatadine-Mometasone (RYALTRIS) 665-25 MCG/ACT SUSP    Sig: Place 2 sprays in each nostril twice a day if needed for nasal symptoms    Dispense:  29 g    Refill:  11   desonide (DESOWEN) 0.05 % cream    Sig: For red or itchy areas apply topically up to twice a day if needed.  Do not use this medication for longer  than 2 weeks in a row.    Dispense:  60 g    Refill:  11   Olopatadine-Mometasone (RYALTRIS) 665-25 MCG/ACT SUSP    Sig: Place 2 sprays in each nostril twice a day if needed for nasal symptoms    Dispense:  29 g    Refill:  11   Olopatadine-Mometasone (RYALTRIS) 665-25 MCG/ACT SUSP    Sig: Place 2 puffs into the nose 2 (two) times daily.    Dispense:  29 g    Refill:  0    Lot Number?:   [id number]    Expiration Date?:   07/04/2024    Quantity:   1    Patient Instructions  Allergic rhinitis Continue allergen avoidance measures directed toward outdoor mold, dust mite, and cat as listed below Continue cetirizine 10 mg once a day if needed for runny nose or itch Try Ryaltris 2 sprays in each nostril twice a day if needed for nasal symptoms Consider saline nasal rinses as needed for nasal symptoms. Use this before any medicated nasal sprays for best result Consider allergen immunotherapy if your symptoms are not well-controlled with the treatment plan as listed above  Allergic conjunctivitis Begin cromolyn eye drops 1-2 drops in each eye up to 4 times a day if needed for red or itchy eyes  Allergic contact dermatitis For red or itchy areas, continue desonide 0.05% up to twice a day if needed.  Do not use this medication for longer than 2 weeks in a row. Continue to take pictures If your symptoms re-occur, begin a journal of events that occurred for up to 6 hours before your symptoms began including foods and beverages consumed, soaps or perfumes you had contact with, and medications.   Tobacco use It is best to cut down on tobacco use and better to quit.  Call the clinic if this treatment plan is not working well for you.  Follow up in 1 year or sooner if needed.  \ Return in about 1 year (around 06/24/2024), or if symptoms worsen or fail to improve.    Thank you for the opportunity to care for this patient.  Please do not hesitate to contact me with questions.  Thermon Leyland,  FNP [facility]

## 2023-06-25 ENCOUNTER — Other Ambulatory Visit: Payer: Self-pay

## 2023-06-25 ENCOUNTER — Ambulatory Visit: Payer: Medicaid Other | Admitting: Family Medicine

## 2023-06-25 ENCOUNTER — Encounter: Payer: Self-pay | Admitting: Family Medicine

## 2023-06-25 VITALS — BP 120/68 | HR 72 | Temp 97.9°F | Resp 18 | Ht 67.0 in | Wt 161.0 lb

## 2023-06-25 DIAGNOSIS — J3089 Other allergic rhinitis: Secondary | ICD-10-CM | POA: Diagnosis not present

## 2023-06-25 DIAGNOSIS — Z720 Tobacco use: Secondary | ICD-10-CM | POA: Diagnosis not present

## 2023-06-25 DIAGNOSIS — L239 Allergic contact dermatitis, unspecified cause: Secondary | ICD-10-CM

## 2023-06-25 DIAGNOSIS — J302 Other seasonal allergic rhinitis: Secondary | ICD-10-CM | POA: Diagnosis not present

## 2023-06-25 DIAGNOSIS — H1013 Acute atopic conjunctivitis, bilateral: Secondary | ICD-10-CM

## 2023-06-25 DIAGNOSIS — H1010 Acute atopic conjunctivitis, unspecified eye: Secondary | ICD-10-CM

## 2023-06-25 MED ORDER — DESONIDE 0.05 % EX CREA: TOPICAL_CREAM | CUTANEOUS | 11 refills | Status: AC

## 2023-06-25 MED ORDER — RYALTRIS 665-25 MCG/ACT NA SUSP: NASAL | 11 refills | Status: DC

## 2023-06-25 MED ORDER — RYALTRIS 665-25 MCG/ACT NA SUSP: NASAL | 11 refills | Status: AC

## 2023-06-25 MED ORDER — CROMOLYN SODIUM 4 % OP SOLN: OPHTHALMIC | 11 refills | Status: AC

## 2023-06-25 MED ORDER — RYALTRIS 665-25 MCG/ACT NA SUSP: 2.0000 | Freq: Two times a day (BID) | NASAL | 0 refills | Status: AC

## 2023-06-25 MED ORDER — CETIRIZINE HCL 10 MG PO TABS: 10.0000 mg | ORAL_TABLET | Freq: Every day | ORAL | 3 refills | Status: AC | PRN

## 2023-06-25 NOTE — Progress Notes (Signed)
 Medication Samples have been provided to the patient.  Drug name: Ryaltris        Strength:        Qty: 1   LOT: [id number]  Exp.Date: 07/04/24  Dosing instructions: 2 sprays each nostril bid   The patient has been instructed regarding the correct time, dose, and frequency of taking this medication, including desired effects and most common side effects.   Orson Aloe 5:21 PM 06/25/2023

## 2023-07-01 IMAGING — MG MM DIGITAL SCREENING BILAT W/ TOMO AND CAD
8 series · 9 of 24 positions shown · non-contrast
Comparison: Previous exam(s).

CLINICAL DATA: Screening.

EXAM:
DIGITAL SCREENING BILATERAL MAMMOGRAM WITH TOMOSYNTHESIS AND CAD
TECHNIQUE: Bilateral screening digital craniocaudal and mediolateral oblique
mammograms were obtained. Bilateral screening digital breast
tomosynthesis was performed. The images were evaluated with
computer-aided detection.

[L MLO synth-2D]
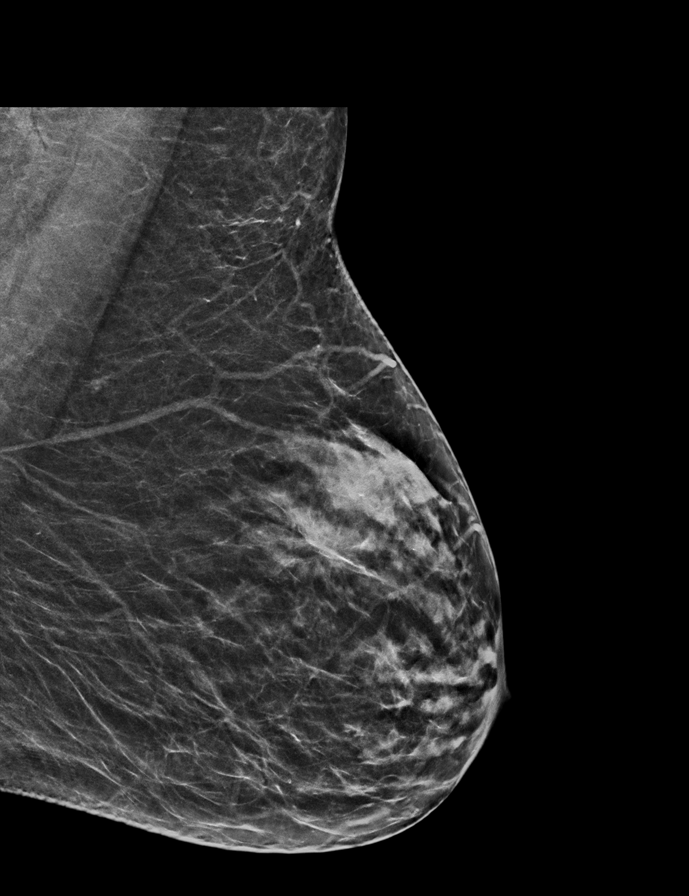

[L CC synth-2D]
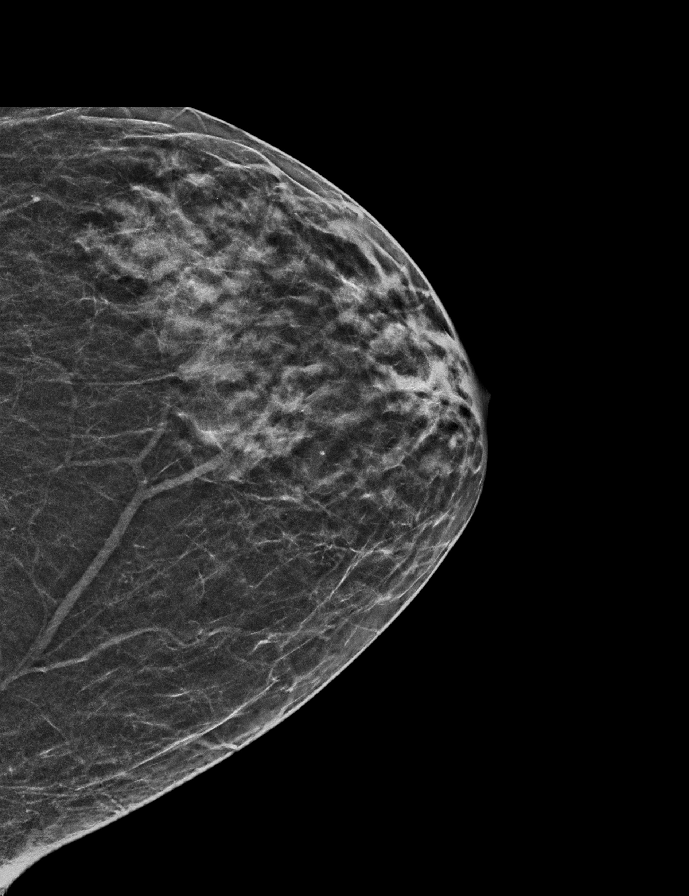

[R CC synth-2D]
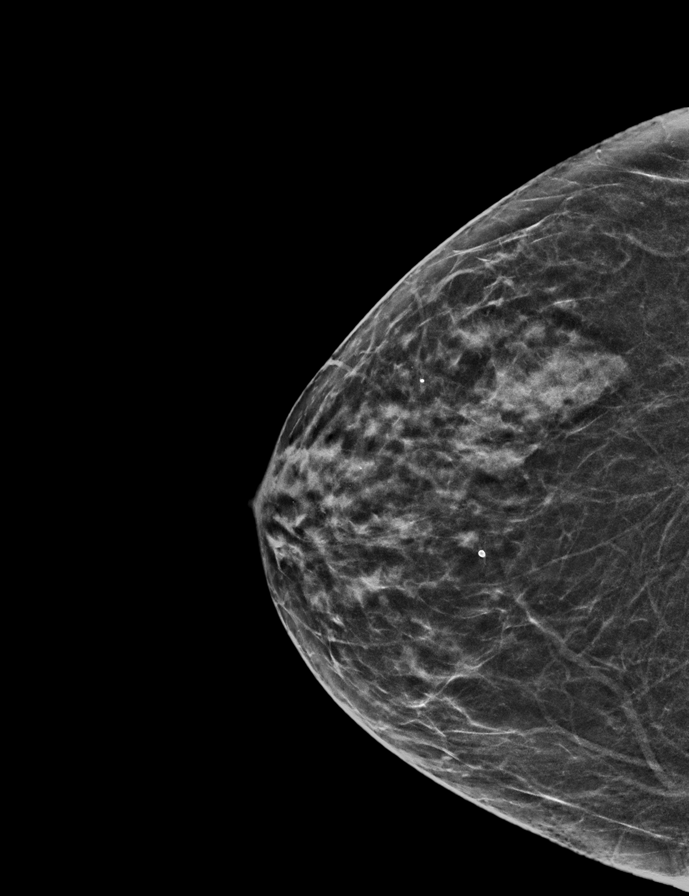

[R MLO synth-2D]
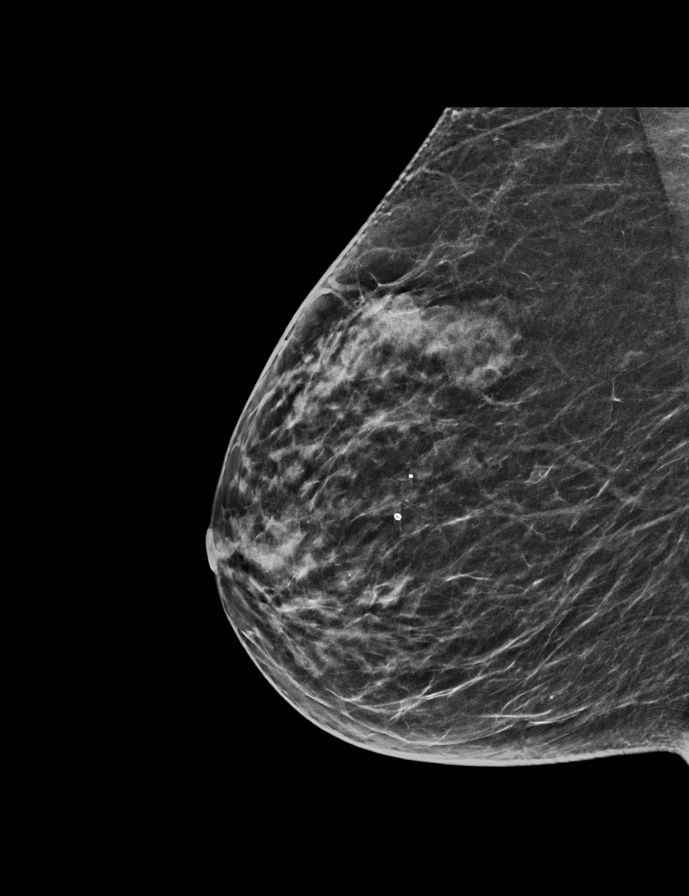

[L CC tomo · 2 of 47 frames shown]
[frame 16/47]
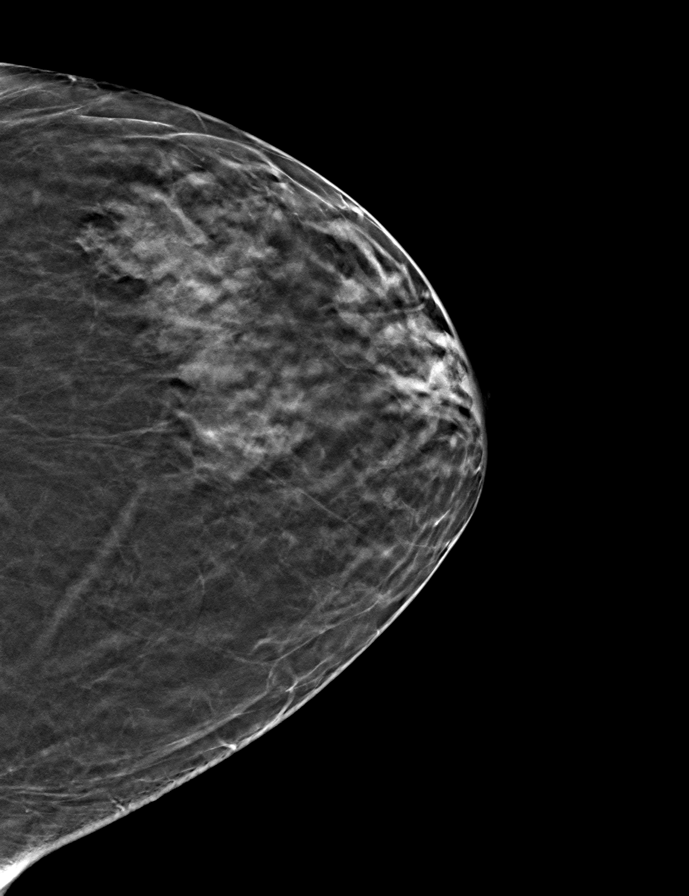
[frame 24/47]
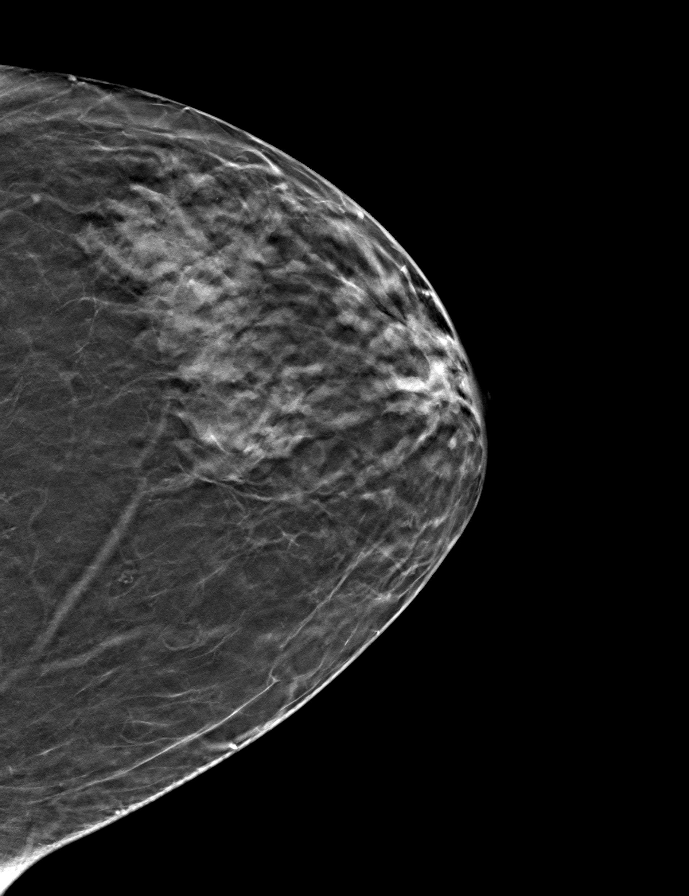

[L MLO tomo · tomo slice 29/57.0]
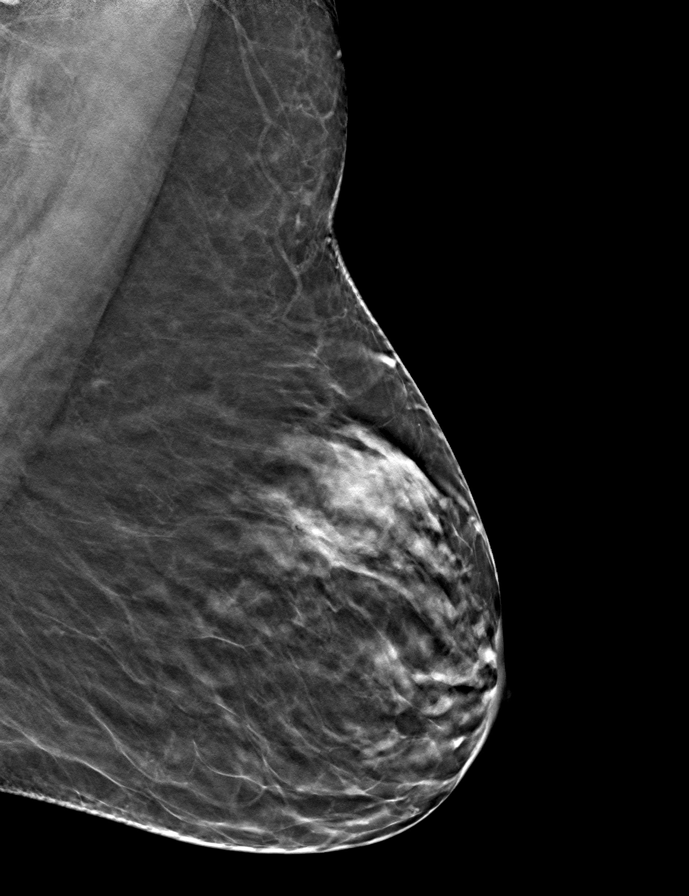

[R CC tomo · tomo slice 27/53.0]
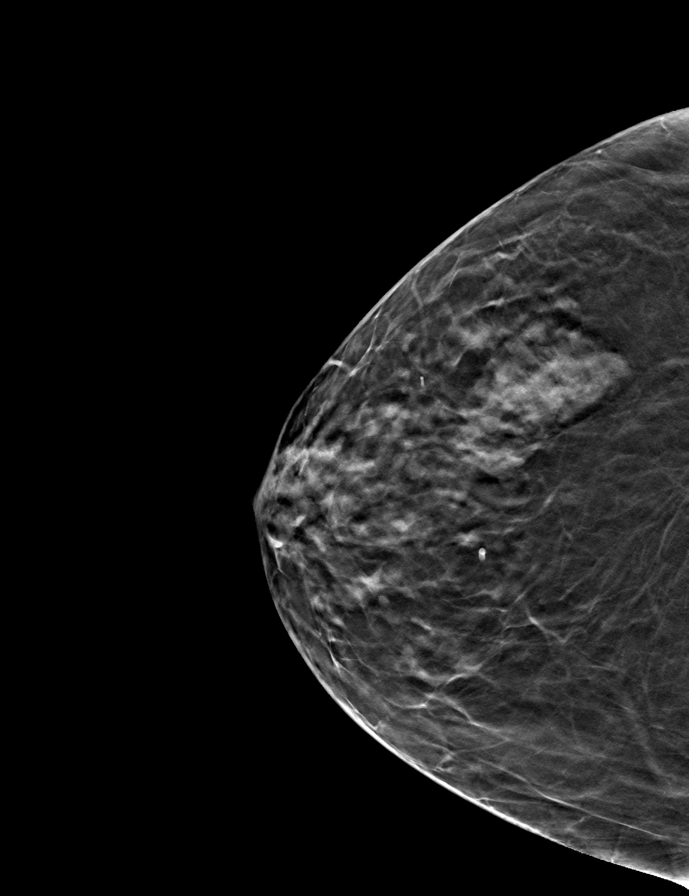

[R MLO tomo · tomo slice 28/55.0]
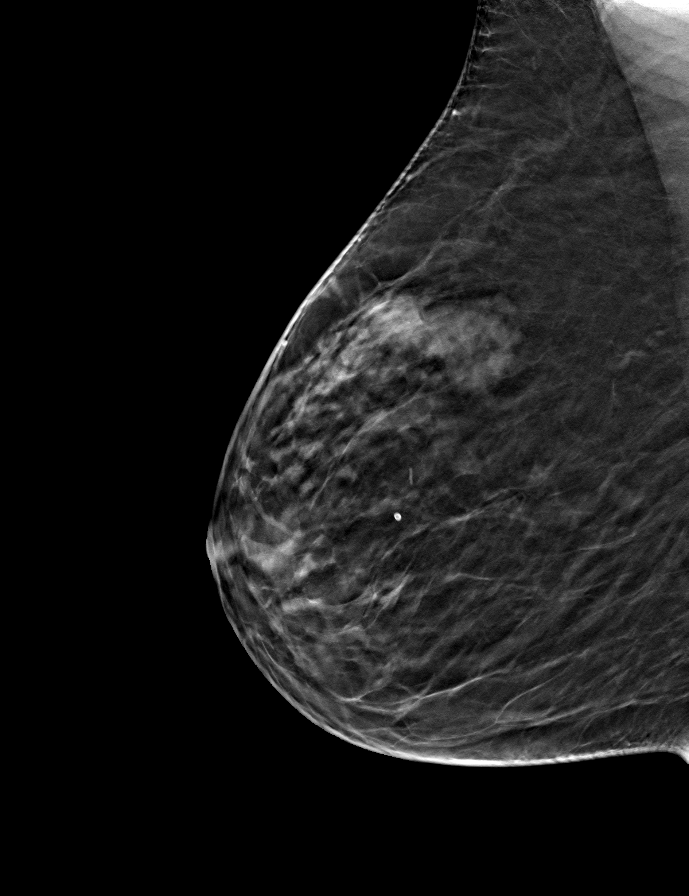

[9 of 24 positions shown; findings below may reference images not displayed]

ACR Breast Density Category c: The breast tissue is heterogeneously
dense, which may obscure small masses.
FINDINGS: There are no findings suspicious for malignancy.
IMPRESSION: No mammographic evidence of malignancy. A result letter of this
screening mammogram will be mailed directly to the patient.

RECOMMENDATION:
Screening mammogram in one year. (Code:Q3-W-BC3)

BI-RADS CATEGORY  1: Negative.

## 2023-08-05 ENCOUNTER — Encounter: Payer: Self-pay | Admitting: *Deleted

## 2023-09-13 ENCOUNTER — Ambulatory Visit: Payer: PRIVATE HEALTH INSURANCE | Admitting: Cardiology

## 2023-11-19 ENCOUNTER — Encounter: Payer: Self-pay | Admitting: Cardiology

## 2023-11-19 ENCOUNTER — Ambulatory Visit: Attending: Cardiology | Admitting: Cardiology

## 2023-11-19 VITALS — BP 116/66 | HR 58 | Ht 67.0 in | Wt 156.0 lb

## 2023-11-19 DIAGNOSIS — E782 Mixed hyperlipidemia: Secondary | ICD-10-CM | POA: Diagnosis present

## 2023-11-19 DIAGNOSIS — I2510 Atherosclerotic heart disease of native coronary artery without angina pectoris: Secondary | ICD-10-CM | POA: Diagnosis present

## 2023-11-19 DIAGNOSIS — I10 Essential (primary) hypertension: Secondary | ICD-10-CM | POA: Diagnosis present

## 2023-11-19 MED ORDER — ATORVASTATIN CALCIUM 80 MG PO TABS: 80.0000 mg | ORAL_TABLET | Freq: Every evening | ORAL | 6 refills | Status: AC

## 2023-11-19 NOTE — Progress Notes (Signed)
 Clinical Summary Ms. [name] is a 63 y.o.female seen today for follow up of the following medical problems.   1.CAD - admit 10/2022 with chest pain, very atypical symptoms. Right upper chest down right arm, lasting hours at a time with a positional component, better with prn tylenol and ibuprofen   - trop is elevated, 274 without clear peak. EKG lateral ST depressions without prior EKG for comparison - echo LVEF 55-60%, no WMAs 10/2022 cath: LAD 20%, LCX mid 30%, RCA distal 99%. RCA received DES   - SOB on brillinta, changed to plavix during admission     - no chest pains, no SOB/DOE - compliant with meds   2. HTN - compliant with meds       3. HLD 02/2023 TC 141 TG 108 HDL 60 LDL 62 - 07/2023 TC 853 HDL 47 TG 157 LDL 76 Past Medical History:  Diagnosis Date   Diabetes mellitus without complication (HCC)    Hypertension      Allergies  Allergen Reactions   Ticagrelor Shortness Of Breath   Prednisone Rash   Lisinopril Swelling     Current Outpatient Medications  Medication Sig Dispense Refill   aspirin EC 81 MG tablet Take 1 tablet (81 mg total) by mouth daily. Swallow whole. 120 tablet 0   atorvastatin (LIPITOR) 40 MG tablet Take 1 tablet (40 mg total) by mouth every evening. 90 tablet 0   Calcium Carbonate Antacid (ANTACID PO) Take 1 tablet by mouth daily.     cetirizine (ZYRTEC) 10 MG tablet Take 1 tablet (10 mg total) by mouth daily as needed for allergies. 90 tablet 3   clopidogrel (PLAVIX) 75 MG tablet Take 1 tablet (75 mg total) by mouth daily. 90 tablet 0   cromolyn (OPTICROM) 4 % ophthalmic solution Place 1-2 drops in each eye up to 4 times a day if needed for red or itchy eyes 10 mL 11   desonide (DESOWEN) 0.05 % cream For red or itchy areas apply topically up to twice a day if needed.  Do not use this medication for longer than 2 weeks in a row. 60 g 11   diphenhydrAMINE (BENADRYL ALLERGY) 25 MG tablet Take 50 mg by mouth 2 (two) times daily. PRN      EPINEPHrine 0.3 mg/0.3 mL IJ SOAJ injection Inject into the muscle.     hydrochlorothiazide (HYDRODIURIL) 12.5 MG tablet Take 12.5 mg by mouth daily.     hydrOXYzine (ATARAX) 25 MG tablet Take 25 mg by mouth 4 (four) times daily as needed.     KRILL OIL PO Take 2 tablets by mouth 2 (two) times daily.     metFORMIN (GLUCOPHAGE) 1000 MG tablet Take 1,000 mg by mouth 2 (two) times daily.     metoprolol succinate (TOPROL-XL) 25 MG 24 hr tablet Take 0.5 tablets (12.5 mg total) by mouth daily. 60 tablet 0   Multiple Vitamin (MULTIVITAMIN ADULT PO) Take 1 tablet by mouth.     nitroGLYCERIN (NITROSTAT) 0.4 MG SL tablet Place 1 tablet (0.4 mg total) under the tongue every 5 (five) minutes x 3 doses as needed for chest pain. 25 tablet 0   Olopatadine-Mometasone (RYALTRIS) 665-25 MCG/ACT SUSP Place 2 sprays in each nostril twice a day if needed for nasal symptoms 29 g 11   Olopatadine-Mometasone (RYALTRIS) 665-25 MCG/ACT SUSP Place 2 puffs into the nose 2 (two) times daily. 29 g 0   psyllium (REGULOID) 0.52 g capsule Take 0.52 g by mouth  daily. (Patient not taking: Reported on 06/25/2023)     traZODone (DESYREL) 100 MG tablet Take 50-100 mg by mouth at bedtime as needed.     triamcinolone ointment (KENALOG) 0.1 % Apply twice daily for flare ups below neck, maximum 10 days. 453.6 g 1   Varenicline Tartrate, Starter, 0.5 MG X 11 & 1 MG X 42 TBPK Take by mouth as directed.     No current facility-administered medications for this visit.     Past Surgical History:  Procedure Laterality Date   ANKLE FRACTURE SURGERY Right    CORONARY STENT INTERVENTION N/A 11/04/2022   Procedure: CORONARY STENT INTERVENTION;  Surgeon: Verlin Lonni BIRCH, MD;  Location: [facility];  Service: Cardiovascular;  Laterality: N/A;   LEFT HEART CATH AND CORONARY ANGIOGRAPHY N/A 11/04/2022   Procedure: LEFT HEART CATH AND CORONARY ANGIOGRAPHY;  Surgeon: Verlin Lonni BIRCH, MD;  Location: [facility];  Service:  Cardiovascular;  Laterality: N/A;     Allergies  Allergen Reactions   Ticagrelor Shortness Of Breath   Prednisone Rash   Lisinopril Swelling      Family History  Problem Relation Age of Onset   Diabetes Father      Social History Ms. Mays reports that she has been smoking cigarettes. She has never been exposed to tobacco smoke. She has never used smokeless tobacco. Ms. [name] reports that she does not currently use alcohol.     Physical Examination Today's Vitals   11/19/23 1254  BP: 116/66  Pulse: (!) 58  SpO2: 98%  Weight: 156 lb (70.8 kg)  Height: 5' 7 (1.702 m)   Body mass index is 24.43 kg/m.  Gen: resting comfortably, no acute distress HEENT: no scleral icterus, pupils equal round and reactive, no palptable cervical adenopathy,  CV: RRR, no mrg, no jvd Resp: Clear to auscultation bilaterally GI: abdomen is soft, non-tender, non-distended, normal bowel sounds, no hepatosplenomegaly MSK: extremities are warm, no edema.  Skin: warm, no rash Neuro:  no focal deficits Psych: appropriate affect   Diagnostic Studies     Assessment and Plan  1.CAD -no recent symptoms - over 1 year since her prior PCI, will d/c plavix - continue other meds - EKG shows SR, chronic ST/T changes   2. HLD - LDL above goal, increase atorvastatin to 80mg  daily.    3.HTN -bp is at goal, continue current meds  F/u 6 months      Dorn PHEBE Ross, M.D.

## 2023-11-19 NOTE — Patient Instructions (Addendum)
 Medication Instructions:   Stop Plavix (Clopidogrel) Increase Atorvastatin to 80mg  daily Continue all other medications.     Labwork:  none  Testing/Procedures:  none  Follow-Up:  6 months   Any Other Special Instructions Will Be Listed Below (If Applicable).   If you need a refill on your cardiac medications before your next appointment, please call your pharmacy.'

## 2023-11-30 ENCOUNTER — Ambulatory Visit: Payer: PRIVATE HEALTH INSURANCE | Admitting: Dermatology

## 2024-02-07 ENCOUNTER — Encounter: Payer: Self-pay | Admitting: *Deleted

## 2024-02-15 ENCOUNTER — Other Ambulatory Visit (HOSPITAL_COMMUNITY): Payer: Self-pay | Admitting: *Deleted

## 2024-02-15 DIAGNOSIS — Z1231 Encounter for screening mammogram for malignant neoplasm of breast: Secondary | ICD-10-CM

## 2024-02-28 ENCOUNTER — Ambulatory Visit (HOSPITAL_COMMUNITY)
Admission: RE | Admit: 2024-02-28 | Discharge: 2024-02-28 | Disposition: A | Discharge status: Home / Self Care | Source: Ambulatory Visit | Attending: *Deleted | Admitting: *Deleted

## 2024-02-28 DIAGNOSIS — Z1231 Encounter for screening mammogram for malignant neoplasm of breast: Secondary | ICD-10-CM | POA: Insufficient documentation

## 2024-05-15 ENCOUNTER — Ambulatory Visit: Admitting: Cardiology

## 2024-05-22 ENCOUNTER — Ambulatory Visit: Admitting: Cardiology

## 2024-06-07 ENCOUNTER — Ambulatory Visit: Admitting: Allergy & Immunology
